# Patient Record
Sex: Female | Born: 1990 | Race: Black or African American | Hispanic: No | Marital: Single | Smoking: Never smoker
Health system: Southern US, Community
[De-identification: ages and names within clinical notes are randomized; demographics above are authoritative.]

## PROBLEM LIST (undated history)

## (undated) DIAGNOSIS — D649 Anemia, unspecified: Secondary | ICD-10-CM

## (undated) DIAGNOSIS — R87629 Unspecified abnormal cytological findings in specimens from vagina: Secondary | ICD-10-CM

## (undated) DIAGNOSIS — Z8619 Personal history of other infectious and parasitic diseases: Secondary | ICD-10-CM

## (undated) DIAGNOSIS — E538 Deficiency of other specified B group vitamins: Secondary | ICD-10-CM

## (undated) HISTORY — DX: Personal history of other infectious and parasitic diseases: Z86.19

## (undated) HISTORY — PX: NO PAST SURGERIES: SHX2092

## (undated) HISTORY — DX: Deficiency of other specified B group vitamins: E53.8

## (undated) HISTORY — DX: Anemia, unspecified: D64.9

---

## 2011-10-17 ENCOUNTER — Encounter: Payer: Self-pay | Admitting: Obstetrics and Gynecology

## 2011-10-25 ENCOUNTER — Encounter: Payer: PRIVATE HEALTH INSURANCE | Admitting: Obstetrics and Gynecology

## 2011-10-28 ENCOUNTER — Encounter: Payer: PRIVATE HEALTH INSURANCE | Admitting: Obstetrics and Gynecology

## 2011-10-28 ENCOUNTER — Encounter (INDEPENDENT_AMBULATORY_CARE_PROVIDER_SITE_OTHER): Payer: PRIVATE HEALTH INSURANCE | Admitting: Registered Nurse

## 2011-10-28 DIAGNOSIS — Z09 Encounter for follow-up examination after completed treatment for conditions other than malignant neoplasm: Secondary | ICD-10-CM

## 2011-12-08 ENCOUNTER — Other Ambulatory Visit: Payer: Self-pay | Admitting: Obstetrics and Gynecology

## 2011-12-08 ENCOUNTER — Ambulatory Visit
Admission: RE | Admit: 2011-12-08 | Discharge: 2011-12-08 | Disposition: A | Discharge status: Home / Self Care | Payer: PRIVATE HEALTH INSURANCE | Source: Ambulatory Visit | Attending: Obstetrics and Gynecology | Admitting: Obstetrics and Gynecology

## 2011-12-08 DIAGNOSIS — N644 Mastodynia: Secondary | ICD-10-CM

## 2012-10-15 ENCOUNTER — Other Ambulatory Visit (HOSPITAL_COMMUNITY)
Admission: RE | Admit: 2012-10-15 | Discharge: 2012-10-15 | Disposition: A | Discharge status: Home / Self Care | Payer: PRIVATE HEALTH INSURANCE | Source: Ambulatory Visit | Attending: Family Medicine | Admitting: Family Medicine

## 2012-10-15 ENCOUNTER — Other Ambulatory Visit: Payer: Self-pay | Admitting: Family Medicine

## 2012-10-15 DIAGNOSIS — Z124 Encounter for screening for malignant neoplasm of cervix: Secondary | ICD-10-CM | POA: Insufficient documentation

## 2013-01-17 ENCOUNTER — Other Ambulatory Visit: Payer: Self-pay

## 2013-01-17 ENCOUNTER — Other Ambulatory Visit: Payer: Self-pay | Admitting: Family Medicine

## 2013-01-17 DIAGNOSIS — Z1231 Encounter for screening mammogram for malignant neoplasm of breast: Secondary | ICD-10-CM

## 2013-01-28 ENCOUNTER — Other Ambulatory Visit: Payer: Self-pay

## 2013-01-28 LAB — OB RESULTS CONSOLE ANTIBODY SCREEN: Antibody Screen: NEGATIVE

## 2013-01-28 LAB — OB RESULTS CONSOLE RUBELLA ANTIBODY, IGM: Rubella: IMMUNE

## 2013-01-28 LAB — OB RESULTS CONSOLE HEPATITIS B SURFACE ANTIGEN: Hepatitis B Surface Ag: NEGATIVE

## 2013-01-28 LAB — OB RESULTS CONSOLE GC/CHLAMYDIA
Chlamydia: NEGATIVE
GC PROBE AMP, GENITAL: NEGATIVE

## 2013-01-28 LAB — OB RESULTS CONSOLE RPR: RPR: NONREACTIVE

## 2013-01-28 LAB — OB RESULTS CONSOLE ABO/RH: RH Type: POSITIVE

## 2013-01-28 LAB — OB RESULTS CONSOLE HIV ANTIBODY (ROUTINE TESTING): HIV: NONREACTIVE

## 2013-07-23 LAB — OB RESULTS CONSOLE GBS: GBS: NEGATIVE

## 2013-08-08 NOTE — L&D Delivery Note (Signed)
Patient was C/C/+2 and pushed for 98 minutes with epidural.   NSVD  female infant, Apgars 8,9, weight P.   The patient had one second degree midline episiotomy repaired with 2-0 Vicryl R. Fundus was firm. EBL was expected. Placenta was delivered intact. Vagina was clear.  Baby was vigorous and doing skin to skin with mother.  Sheneka Schrom A

## 2013-08-21 ENCOUNTER — Telehealth (HOSPITAL_COMMUNITY): Payer: Self-pay | Admitting: *Deleted

## 2013-08-21 ENCOUNTER — Encounter (HOSPITAL_COMMUNITY): Payer: Self-pay | Admitting: *Deleted

## 2013-08-21 NOTE — Telephone Encounter (Signed)
Preadmission screen  

## 2013-08-22 ENCOUNTER — Inpatient Hospital Stay (HOSPITAL_COMMUNITY): Payer: PRIVATE HEALTH INSURANCE

## 2013-08-28 ENCOUNTER — Inpatient Hospital Stay (HOSPITAL_COMMUNITY): Payer: PRIVATE HEALTH INSURANCE | Admitting: Anesthesiology

## 2013-08-28 ENCOUNTER — Encounter (HOSPITAL_COMMUNITY): Payer: Self-pay | Admitting: *Deleted

## 2013-08-28 ENCOUNTER — Inpatient Hospital Stay (HOSPITAL_COMMUNITY): Admission: RE | Admit: 2013-08-28 | Payer: PRIVATE HEALTH INSURANCE | Source: Ambulatory Visit

## 2013-08-28 ENCOUNTER — Inpatient Hospital Stay (HOSPITAL_COMMUNITY)
Admission: AD | Admit: 2013-08-28 | Discharge: 2013-08-30 | DRG: 774 | Disposition: A | Discharge status: Home / Self Care | Payer: PRIVATE HEALTH INSURANCE | Source: Ambulatory Visit | Attending: Obstetrics and Gynecology | Admitting: Obstetrics and Gynecology

## 2013-08-28 ENCOUNTER — Encounter (HOSPITAL_COMMUNITY): Payer: PRIVATE HEALTH INSURANCE | Admitting: Anesthesiology

## 2013-08-28 DIAGNOSIS — O4290 Premature rupture of membranes, unspecified as to length of time between rupture and onset of labor, unspecified weeks of gestation: Principal | ICD-10-CM | POA: Diagnosis present

## 2013-08-28 DIAGNOSIS — O480 Post-term pregnancy: Secondary | ICD-10-CM | POA: Diagnosis present

## 2013-08-28 DIAGNOSIS — O411090 Infection of amniotic sac and membranes, unspecified, unspecified trimester, not applicable or unspecified: Secondary | ICD-10-CM | POA: Diagnosis present

## 2013-08-28 HISTORY — DX: Unspecified abnormal cytological findings in specimens from vagina: R87.629

## 2013-08-28 LAB — CBC
HEMATOCRIT: 35.4 % — AB (ref 36.0–46.0)
Hemoglobin: 11.8 g/dL — ABNORMAL LOW (ref 12.0–15.0)
MCH: 31.1 pg (ref 26.0–34.0)
MCHC: 33.3 g/dL (ref 30.0–36.0)
MCV: 93.4 fL (ref 78.0–100.0)
Platelets: 236 10*3/uL (ref 150–400)
RBC: 3.79 MIL/uL — ABNORMAL LOW (ref 3.87–5.11)
RDW: 13.2 % (ref 11.5–15.5)
WBC: 12.6 10*3/uL — ABNORMAL HIGH (ref 4.0–10.5)

## 2013-08-28 LAB — ABO/RH: ABO/RH(D): A POS

## 2013-08-28 LAB — POCT FERN TEST: POCT Fern Test: POSITIVE

## 2013-08-28 LAB — TYPE AND SCREEN
ABO/RH(D): A POS
Antibody Screen: NEGATIVE

## 2013-08-28 LAB — RPR: RPR: NONREACTIVE

## 2013-08-28 MED ORDER — OXYTOCIN 40 UNITS IN LACTATED RINGERS INFUSION - SIMPLE MED
1.0000 m[IU]/min | INTRAVENOUS | Status: DC
  Administered 2013-08-28: 4 m[IU]/min via INTRAVENOUS
  Administered 2013-08-28: 8 m[IU]/min via INTRAVENOUS
  Administered 2013-08-28: 6 m[IU]/min via INTRAVENOUS
  Administered 2013-08-28: 2 m[IU]/min via INTRAVENOUS
  Filled 2013-08-28: qty 1000

## 2013-08-28 MED ORDER — PHENYLEPHRINE 40 MCG/ML (10ML) SYRINGE FOR IV PUSH (FOR BLOOD PRESSURE SUPPORT)
80.0000 ug | PREFILLED_SYRINGE | INTRAVENOUS | Status: DC | PRN
  Filled 2013-08-28: qty 2

## 2013-08-28 MED ORDER — LIDOCAINE HCL (PF) 1 % IJ SOLN
30.0000 mL | INTRAMUSCULAR | Status: DC | PRN
  Filled 2013-08-28 (×2): qty 30

## 2013-08-28 MED ORDER — FENTANYL 2.5 MCG/ML BUPIVACAINE 1/10 % EPIDURAL INFUSION (WH - ANES)
INTRAMUSCULAR | Status: DC | PRN
  Administered 2013-08-28: 15 mL/h via EPIDURAL

## 2013-08-28 MED ORDER — OXYCODONE-ACETAMINOPHEN 5-325 MG PO TABS: 1.0000 | ORAL_TABLET | ORAL | Status: DC | PRN

## 2013-08-28 MED ORDER — TERBUTALINE SULFATE 1 MG/ML IJ SOLN: 0.2500 mg | Freq: Once | INTRAMUSCULAR | Status: AC | PRN

## 2013-08-28 MED ORDER — LACTATED RINGERS IV SOLN
500.0000 mL | Freq: Once | INTRAVENOUS | Status: AC
  Administered 2013-08-28: 500 mL via INTRAVENOUS

## 2013-08-28 MED ORDER — ACETAMINOPHEN 500 MG PO TABS
1000.0000 mg | ORAL_TABLET | Freq: Four times a day (QID) | ORAL | Status: DC | PRN
  Administered 2013-08-28 – 2013-08-29 (×2): 1000 mg via ORAL
  Filled 2013-08-28 (×3): qty 2

## 2013-08-28 MED ORDER — FENTANYL 2.5 MCG/ML BUPIVACAINE 1/10 % EPIDURAL INFUSION (WH - ANES)
INTRAMUSCULAR | Status: AC
Start: 2013-08-28 — End: 2013-08-29
  Filled 2013-08-28: qty 125

## 2013-08-28 MED ORDER — DEXTROSE 5 % IV SOLN
220.0000 mg | Freq: Three times a day (TID) | INTRAVENOUS | Status: DC
  Administered 2013-08-28 – 2013-08-29 (×2): 220 mg via INTRAVENOUS
  Filled 2013-08-28 (×3): qty 5.5

## 2013-08-28 MED ORDER — EPHEDRINE 5 MG/ML INJ
INTRAVENOUS | Status: AC
  Filled 2013-08-28: qty 4

## 2013-08-28 MED ORDER — OXYTOCIN BOLUS FROM INFUSION: 500.0000 mL | INTRAVENOUS | Status: DC

## 2013-08-28 MED ORDER — ACETAMINOPHEN 325 MG PO TABS: 650.0000 mg | ORAL_TABLET | ORAL | Status: DC | PRN

## 2013-08-28 MED ORDER — DIPHENHYDRAMINE HCL 50 MG/ML IJ SOLN: 12.5000 mg | INTRAMUSCULAR | Status: DC | PRN

## 2013-08-28 MED ORDER — SODIUM CHLORIDE 0.9 % IV SOLN
2.0000 g | Freq: Four times a day (QID) | INTRAVENOUS | Status: DC
  Administered 2013-08-28 – 2013-08-29 (×2): 2 g via INTRAVENOUS
  Filled 2013-08-28 (×4): qty 2000

## 2013-08-28 MED ORDER — OXYTOCIN 40 UNITS IN LACTATED RINGERS INFUSION - SIMPLE MED
62.5000 mL/h | INTRAVENOUS | Status: DC
  Administered 2013-08-29: 62.5 mL/h via INTRAVENOUS

## 2013-08-28 MED ORDER — LACTATED RINGERS IV SOLN
INTRAVENOUS | Status: DC
  Administered 2013-08-28 – 2013-08-29 (×4): via INTRAVENOUS

## 2013-08-28 MED ORDER — LACTATED RINGERS IV SOLN
500.0000 mL | INTRAVENOUS | Status: DC | PRN
  Administered 2013-08-28: 200 mL via INTRAVENOUS

## 2013-08-28 MED ORDER — IBUPROFEN 600 MG PO TABS: 600.0000 mg | ORAL_TABLET | Freq: Four times a day (QID) | ORAL | Status: DC | PRN

## 2013-08-28 MED ORDER — ONDANSETRON HCL 4 MG/2ML IJ SOLN: 4.0000 mg | Freq: Four times a day (QID) | INTRAMUSCULAR | Status: DC | PRN

## 2013-08-28 MED ORDER — EPHEDRINE 5 MG/ML INJ
10.0000 mg | INTRAVENOUS | Status: DC | PRN
  Filled 2013-08-28: qty 2

## 2013-08-28 MED ORDER — PHENYLEPHRINE 40 MCG/ML (10ML) SYRINGE FOR IV PUSH (FOR BLOOD PRESSURE SUPPORT)
PREFILLED_SYRINGE | INTRAVENOUS | Status: AC
  Filled 2013-08-28: qty 10

## 2013-08-28 MED ORDER — CITRIC ACID-SODIUM CITRATE 334-500 MG/5ML PO SOLN: 30.0000 mL | ORAL | Status: DC | PRN

## 2013-08-28 MED ORDER — LIDOCAINE HCL (PF) 1 % IJ SOLN
INTRAMUSCULAR | Status: DC | PRN
  Administered 2013-08-28: 5 mL
  Administered 2013-08-28: 4 mL

## 2013-08-28 MED ORDER — FENTANYL 2.5 MCG/ML BUPIVACAINE 1/10 % EPIDURAL INFUSION (WH - ANES)
14.0000 mL/h | INTRAMUSCULAR | Status: DC | PRN
  Administered 2013-08-29: 14 mL/h via EPIDURAL
  Filled 2013-08-28: qty 125

## 2013-08-28 NOTE — MAU Note (Signed)
UC's since 0530. States felt a gush of fluid after arrival to MAU. Fern slide pending.

## 2013-08-28 NOTE — Anesthesia Procedure Notes (Signed)
Epidural Patient location during procedure: OB Start time: 08/28/2013 2:23 PM  Staffing Anesthesiologist: Michaeljohn Biss A. Performed by: anesthesiologist   Preanesthetic Checklist Completed: patient identified, site marked, surgical consent, pre-op evaluation, timeout performed, IV checked, risks and benefits discussed and monitors and equipment checked  Epidural Patient position: sitting Prep: site prepped and draped and DuraPrep Patient monitoring: continuous pulse ox and blood pressure Approach: midline Injection technique: LOR air  Needle:  Needle type: Tuohy  Needle gauge: 17 G Needle length: 9 cm and 9 Needle insertion depth: 6 cm Catheter type: closed end flexible Catheter size: 19 Gauge Catheter at skin depth: 11 cm Test dose: negative and Other  Assessment Events: blood not aspirated, injection not painful, no injection resistance, negative IV test and no paresthesia  Additional Notes Patient identified. Risks and benefits discussed including failed block, incomplete  Pain control, post dural puncture headache, nerve damage, paralysis, blood pressure Changes, nausea, vomiting, reactions to medications-both toxic and allergic and post Partum back pain. All questions were answered. Patient expressed understanding and wished to proceed. Sterile technique was used throughout procedure. Epidural site was Dressed with sterile barrier dressing. No paresthesias, signs of intravascular injection Or signs of intrathecal spread were encountered.  Patient was more comfortable after the epidural was dosed. Please see RN's note for documentation of vital signs and FHR which are stable.

## 2013-08-28 NOTE — H&P (Signed)
23 y.o. 3990w3d  G2P0010 comes in c/o ctx, did not change cervix however started LOF in MAU, found to be ruptured.  Otherwise has good fetal movement and no bleeding.  Past Medical History  Diagnosis Date  . Hx of varicella   . Vaginal Pap smear, abnormal     Past Surgical History  Procedure Laterality Date  . No past surgeries      OB History  Gravida Para Term Preterm AB SAB TAB Ectopic Multiple Living  2    1  1    0    # Outcome Date GA Lbr Len/2nd Weight Sex Delivery Anes PTL Lv  2 CUR           1 TAB 2011              History   Social History  . Marital Status: Single    Spouse Name: N/A    Number of Children: N/A  . Years of Education: N/A   Occupational History  . Not on file.   Social History Main Topics  . Smoking status: Never Smoker   . Smokeless tobacco: Never Used  . Alcohol Use: No  . Drug Use: No  . Sexual Activity: Yes   Other Topics Concern  . Not on file   Social History Narrative  . No narrative on file   Review of patient's allergies indicates no known allergies.    Prenatal Transfer Tool  Maternal Diabetes: No Genetic Screening: Declined Maternal Ultrasounds/Referrals: Normal Fetal Ultrasounds or other Referrals:  None Maternal Substance Abuse:  No Significant Maternal Medications:  None Significant Maternal Lab Results: Lab values include: Group B Strep negative  Other PNC: uncomplicated.    Filed Vitals:   08/28/13 1701  BP: 111/62  Pulse: 105  Temp:   Resp: 18     Lungs/Cor:  NAD Abdomen:  soft, gravid Ex:  no cords, erythema SVE:  1/7-/-3, now 2/80 FHTs:  140 good STV, NST R Toco:  q3  A/P   Admit with PROM  GBS neg  Pit 2x2  Comfortable with epidural  Desires circumcision.  Philip AspenALLAHAN, Sylvia Helms

## 2013-08-28 NOTE — Anesthesia Preprocedure Evaluation (Signed)

## 2013-08-28 NOTE — Progress Notes (Signed)
ANTIBIOTIC CONSULT NOTE - INITIAL  Pharmacy Consult for Gentamicin Indication: Maternal temp/ r/o chorioamnionitis  No Known Allergies  Patient Measurements: Height: 5' 9.5" (176.5 cm) Weight: 253 lb (114.76 kg) IBW/kg (Calculated) : 67.35 Adjusted Body Weight: 81.6kg  Vital Signs: Temp: 100.6 F (38.1 C) (01/21 1932) Temp src: Axillary (01/21 1932) BP: 136/70 mmHg (01/21 1932) Pulse Rate: 99 (01/21 1932)   Labs:  Recent Labs  08/28/13 1020  WBC 12.6*  HGB 11.8*  PLT 236   Estimated SCr=0.7 with calculated CrCl > 18020ml/min.  Microbiology: No results found for this or any previous visit (from the past 720 hour(s)).  Medical History: Past Medical History  Diagnosis Date  . Hx of varicella   . Vaginal Pap smear, abnormal     Medications:  Ampicillin 2 gram IV q6h  Assessment: 23yo F 41+ weeks admitted with contractions and PROM. Pt has now temp during labor. Ampicillin and Gentamicin initiated to r/o chorioamnionitis.  Goal of Therapy:  Gentamicin peak 6-298mcg/ml  And trough < 331mcg/ml  Plan:  1. Gentamicin 220mg  IV q8h. 2. Will draw SCr if Gentamicin continued postpartum. 3. Will continue to follow and assess need for further kinetic workup. Thanks!  Claybon Jabsngel, Yuleidy Rappleye G 08/28/2013,8:07 PM

## 2013-08-29 ENCOUNTER — Encounter (HOSPITAL_COMMUNITY): Payer: Self-pay | Admitting: *Deleted

## 2013-08-29 MED ORDER — PRENATAL MULTIVITAMIN CH
1.0000 | ORAL_TABLET | Freq: Every day | ORAL | Status: DC
  Administered 2013-08-29 – 2013-08-30 (×2): 1 via ORAL
  Filled 2013-08-29 (×2): qty 1

## 2013-08-29 MED ORDER — MEASLES, MUMPS & RUBELLA VAC ~~LOC~~ INJ
0.5000 mL | INJECTION | Freq: Once | SUBCUTANEOUS | Status: DC
  Filled 2013-08-29: qty 0.5

## 2013-08-29 MED ORDER — FERROUS SULFATE 325 (65 FE) MG PO TABS
325.0000 mg | ORAL_TABLET | Freq: Two times a day (BID) | ORAL | Status: DC
  Administered 2013-08-29 – 2013-08-30 (×2): 325 mg via ORAL
  Filled 2013-08-29: qty 1

## 2013-08-29 MED ORDER — SODIUM CHLORIDE 0.9 % IJ SOLN
3.0000 mL | INTRAMUSCULAR | Status: DC | PRN
Start: 2013-08-29 — End: 2013-08-30

## 2013-08-29 MED ORDER — DIPHENHYDRAMINE HCL 25 MG PO CAPS
25.0000 mg | ORAL_CAPSULE | Freq: Four times a day (QID) | ORAL | Status: DC | PRN
Start: 2013-08-29 — End: 2013-08-30

## 2013-08-29 MED ORDER — SODIUM CHLORIDE 0.9 % IV SOLN
250.0000 mL | INTRAVENOUS | Status: DC | PRN
Start: 2013-08-29 — End: 2013-08-30

## 2013-08-29 MED ORDER — TETANUS-DIPHTH-ACELL PERTUSSIS 5-2.5-18.5 LF-MCG/0.5 IM SUSP
0.5000 mL | Freq: Once | INTRAMUSCULAR | Status: AC
  Administered 2013-08-30: 0.5 mL via INTRAMUSCULAR
  Filled 2013-08-29: qty 0.5

## 2013-08-29 MED ORDER — IBUPROFEN 800 MG PO TABS
800.0000 mg | ORAL_TABLET | Freq: Three times a day (TID) | ORAL | Status: DC
  Administered 2013-08-29 – 2013-08-30 (×4): 800 mg via ORAL
  Filled 2013-08-29 (×4): qty 1

## 2013-08-29 MED ORDER — SENNOSIDES-DOCUSATE SODIUM 8.6-50 MG PO TABS
2.0000 | ORAL_TABLET | ORAL | Status: DC
  Administered 2013-08-29: 2 via ORAL
  Filled 2013-08-29: qty 2

## 2013-08-29 MED ORDER — SIMETHICONE 80 MG PO CHEW: 80.0000 mg | CHEWABLE_TABLET | ORAL | Status: DC | PRN

## 2013-08-29 MED ORDER — BENZOCAINE-MENTHOL 20-0.5 % EX AERO
1.0000 | INHALATION_SPRAY | CUTANEOUS | Status: DC | PRN
Start: 2013-08-29 — End: 2013-08-30
  Filled 2013-08-29: qty 56

## 2013-08-29 MED ORDER — ONDANSETRON HCL 4 MG PO TABS: 4.0000 mg | ORAL_TABLET | ORAL | Status: DC | PRN

## 2013-08-29 MED ORDER — ZOLPIDEM TARTRATE 5 MG PO TABS: 5.0000 mg | ORAL_TABLET | Freq: Every evening | ORAL | Status: DC | PRN

## 2013-08-29 MED ORDER — ONDANSETRON HCL 4 MG/2ML IJ SOLN: 4.0000 mg | INTRAMUSCULAR | Status: DC | PRN

## 2013-08-29 MED ORDER — OXYCODONE-ACETAMINOPHEN 5-325 MG PO TABS: 1.0000 | ORAL_TABLET | ORAL | Status: DC | PRN

## 2013-08-29 MED ORDER — WITCH HAZEL-GLYCERIN EX PADS: 1.0000 "application " | MEDICATED_PAD | CUTANEOUS | Status: DC | PRN

## 2013-08-29 MED ORDER — MAGNESIUM HYDROXIDE 400 MG/5ML PO SUSP
30.0000 mL | ORAL | Status: DC | PRN
Start: 2013-08-29 — End: 2013-08-30

## 2013-08-29 MED ORDER — METHYLERGONOVINE MALEATE 0.2 MG/ML IJ SOLN: 0.2000 mg | INTRAMUSCULAR | Status: DC | PRN

## 2013-08-29 MED ORDER — LANOLIN HYDROUS EX OINT
TOPICAL_OINTMENT | CUTANEOUS | Status: DC | PRN
Start: 2013-08-29 — End: 2013-08-30

## 2013-08-29 MED ORDER — SODIUM CHLORIDE 0.9 % IJ SOLN
3.0000 mL | Freq: Two times a day (BID) | INTRAMUSCULAR | Status: DC
  Administered 2013-08-29: 3 mL via INTRAVENOUS

## 2013-08-29 MED ORDER — DIBUCAINE 1 % RE OINT
1.0000 | TOPICAL_OINTMENT | RECTAL | Status: DC | PRN
Start: 2013-08-29 — End: 2013-08-30

## 2013-08-29 MED ORDER — METHYLERGONOVINE MALEATE 0.2 MG PO TABS: 0.2000 mg | ORAL_TABLET | ORAL | Status: DC | PRN

## 2013-08-29 NOTE — Lactation Note (Signed)
This note was copied from the chart of Boy Devona KonigFelia Werden. Lactation Consultation Note  Patient Name: Boy Devona KonigFelia Hurston HYQMV'HToday's Date: 08/29/2013 Reason for consult: Initial assessment Per mom recently had a bath and is skin to skin  At present.  Baby noted to be rooting, LC offered to assist with latch due to feeding  Cues. Mom declined at present time and plans on  paging because he is skin to skin. LC explained LATCH scoring and the importance of feeding assessments.  Mom aware of the [facility] and the LC O/Pservices.     Maternal Data Formula Feeding for Exclusion: No Infant to breast within first hour of birth: Yes Does the patient have breastfeeding experience prior to this delivery?: No  Feeding Feeding Type:  (LC offered to assist with latch, mom declined at this time , will page , baby skin to skin )  LATCH Score/Interventions Latch:  (LC explained the latch scoring , enc to page for feeding assess)  Audible Swallowing: None  Type of Nipple: Everted at rest and after stimulation  Comfort (Breast/Nipple): Soft / non-tender     Hold (Positioning): Assistance needed to correctly position infant at breast and maintain latch.  LATCH Score: 5  Lactation Tools Discussed/Used WIC Program: Yes   Consult Status Consult Status: Follow-up (To page ) Date: 08/29/13 Follow-up type: In-patient    Kathrin Greathouseorio, Terrianna Holsclaw Ann 08/29/2013, 3:04 PM

## 2013-08-29 NOTE — Lactation Note (Signed)
This note was copied from the chart of Andrea Rojas. Lactation Consultation Note Follow up visit at 13 hours of age.  Family reports baby just got a bottle 10 minutes ago.  Maybe 1 ml out of bottle.  Mom reports she wants to breast and formula feed.  Discussed the importance of exclusively with breast feeding to establish milk supply. Discussed frequency, out put, cue feedings, cluster feedings and hand expression. Mom reports she can hand express and sees colostrum.  Visitor holding baby, baby awake and cuing.  Encouraged mom to breast feed and offered to assist, mom declines.  Encouraged mom to think about how she wants to feed baby and discuss with nurse to make sure baby eats something.  Unsure if baby has good suck and swallow with bottle. Mom appears to have flat affect. Offered support to mom and encouraged her to call for assist as needed. [facility].    Patient Name: Andrea Rojas [id number]'XToday's Date: 08/29/2013 Reason for consult: Follow-up assessment   Maternal Data    Feeding    LATCH Score/Interventions                      Lactation Tools Discussed/Used     Consult Status Consult Status: PRN    Jannifer RodneyShoptaw, Drena Ham Lynn 08/29/2013, 9:21 PM

## 2013-08-29 NOTE — Anesthesia Postprocedure Evaluation (Signed)
  Anesthesia Post-op Note  Patient: Andrea Rojas  Procedure(s) Performed: * No procedures listed *  Patient Location: Mother/Baby  Anesthesia Type:Epidural  Level of Consciousness: awake  Airway and Oxygen Therapy: Patient Spontanous Breathing  Post-op Pain: mild  Post-op Assessment: Patient's Cardiovascular Status Stable and Respiratory Function Stable  Post-op Vital Signs: stable  Complications: No apparent anesthesia complications

## 2013-08-30 LAB — CBC
HCT: 28.9 % — ABNORMAL LOW (ref 36.0–46.0)
Hemoglobin: 9.7 g/dL — ABNORMAL LOW (ref 12.0–15.0)
MCH: 31.3 pg (ref 26.0–34.0)
MCHC: 33.6 g/dL (ref 30.0–36.0)
MCV: 93.2 fL (ref 78.0–100.0)
PLATELETS: 218 10*3/uL (ref 150–400)
RBC: 3.1 MIL/uL — AB (ref 3.87–5.11)
RDW: 13.3 % (ref 11.5–15.5)
WBC: 16.4 10*3/uL — AB (ref 4.0–10.5)

## 2013-08-30 MED ORDER — DOCUSATE SODIUM 100 MG PO CAPS: 100.0000 mg | ORAL_CAPSULE | Freq: Two times a day (BID) | ORAL | Status: DC

## 2013-08-30 MED ORDER — OXYCODONE-ACETAMINOPHEN 5-325 MG PO TABS: 2.0000 | ORAL_TABLET | ORAL | Status: DC | PRN

## 2013-08-30 MED ORDER — IBUPROFEN 600 MG PO TABS: 600.0000 mg | ORAL_TABLET | Freq: Four times a day (QID) | ORAL | Status: DC | PRN

## 2013-08-30 NOTE — Discharge Summary (Signed)
Obstetric Discharge Summary Reason for Admission: onset of labor Prenatal Procedures: none Intrapartum Procedures: spontaneous vaginal delivery Postpartum Procedures: none Complications-Operative and Postpartum: 2nd degree perineal laceration Hemoglobin  Date Value Range Status  08/30/2013 9.7* 12.0 - 15.0 g/dL Final     DELTA CHECK NOTED     REPEATED TO VERIFY     HCT  Date Value Range Status  08/30/2013 28.9* 36.0 - 46.0 % Final    Physical Exam:  General: alert, cooperative and appears stated age 65Lochia: appropriate Uterine Fundus: firm  Discharge Diagnoses: Term Pregnancy-delivered and Post-date pregnancy  Discharge Information: Date: 08/30/2013 Activity: pelvic rest Diet: routine Medications: Ibuprofen, Colace and Percocet Condition: improved Instructions: refer to practice specific booklet Discharge to: home Follow-up Information   Follow up with HORVATH,MICHELLE A, MD In 4 weeks. (For a PP evaluation)    Specialty:  Obstetrics and Gynecology   Contact information:   [address] [place] [address] [phone]       Newborn Data: Live born female  Birth Weight: 8 lb 6.2 oz (3805 g) APGAR: 8, 9  Home with mother.  Lugene Beougher H. 08/30/2013, 9:55 AM

## 2013-08-30 NOTE — Discharge Instructions (Signed)
Iron-Rich Diet  An iron-rich diet contains foods that are good sources of iron. Iron is an important mineral that helps your body produce hemoglobin. Hemoglobin is a protein in red blood cells that carries oxygen to the body's tissues. Sometimes, the iron level in your blood can be low. This may be caused by:  A lack of iron in your diet.  Blood loss.  Times of growth, such as during pregnancy or during a child's growth and development. Low levels of iron can cause a decrease in the number of red blood cells. This can result in iron deficiency anemia. Iron deficiency anemia symptoms include:  Tiredness.  Weakness.  Irritability.  Increased chance of infection. Here are some recommendations for daily iron intake:  Males older than 25 years of age need 8 mg of iron per day.  Women ages 1019 to 3750 need 18 mg of iron per day.  Pregnant women need 27 mg of iron per day, and women who are over 25 years of age and breastfeeding need 9 mg of iron per day.  Women over the age of 25 need 8 mg of iron per day. SOURCES OF IRON There are 2 types of iron that are found in food: heme iron and nonheme iron. Heme iron is absorbed by the body better than nonheme iron. Heme iron is found in meat, poultry, and fish. Nonheme iron is found in grains, beans, and vegetables. Heme Iron Sources Food / Iron (mg)  Chicken liver, 3 oz (85 g)/ 10 mg  Beef liver, 3 oz (85 g)/ 5.5 mg  Oysters, 3 oz (85 g)/ 8 mg  Beef, 3 oz (85 g)/ 2 to 3 mg  Shrimp, 3 oz (85 g)/ 2.8 mg  [place], 3 oz (85 g)/ 2 mg  Chicken, 3 oz (85 g) / 1 mg  Fish (tuna, halibut), 3 oz (85 g)/ 1 mg  Pork, 3 oz (85 g)/ 0.9 mg Nonheme Iron Sources Food / Iron (mg)  Ready-to-eat breakfast cereal, iron-fortified / 3.9 to 7 mg  Tofu,  cup / 3.4 mg  Kidney beans,  cup / 2.6 mg  Baked potato with skin / 2.7 mg  Asparagus,  cup / 2.2 mg  Avocado / 2 mg  Dried peaches,  cup / 1.6 mg  Raisins,  cup / 1.5 mg  Soy milk,  1 cup / 1.5 mg  Whole-wheat bread, 1 slice / 1.2 mg  Spinach, 1 cup / 0.8 mg  Broccoli,  cup / 0.6 mg IRON ABSORPTION Certain foods can decrease the body's absorption of iron. Try to avoid these foods and beverages while eating meals with iron-containing foods:  Coffee.  Tea.  Fiber.  Soy. Foods containing vitamin C can help increase the amount of iron your body absorbs from iron sources, especially from nonheme sources. Eat foods with vitamin C along with iron-containing foods to increase your iron absorption. Foods that are high in vitamin C include many fruits and vegetables. Some good sources are:  Fresh orange juice.  Oranges.  Strawberries.  Mangoes.  Grapefruit.  Red bell peppers.  Green bell peppers.  Broccoli.  Potatoes with skin.  Tomato juice. Document Released: 03/08/2005 Document Revised: 10/17/2011 Document Reviewed: 01/13/2011 [facility] [employer] Patient Information 2014 [place], [place].  Breastfeeding Deciding to breastfeed is one of the best choices you can make for you and your baby. A change in hormones during pregnancy causes your breast tissue to grow and increases the number and size of your milk ducts. These hormones  also allow proteins, sugars, and fats from your blood supply to make breast milk in your milk-producing glands. Hormones prevent breast milk from being released before your baby is born as well as prompt milk flow after birth. Once breastfeeding has begun, thoughts of your baby, as well as his or her sucking or crying, can stimulate the release of milk from your milk-producing glands.  BENEFITS OF BREASTFEEDING For Your Baby  Your first milk (colostrum) helps your baby's digestive system function better.   There are antibodies in your milk that help your baby fight off infections.   Your baby has a lower incidence of asthma, allergies, and sudden infant death syndrome.   The nutrients in breast milk are better for your baby than infant  formulas and are designed uniquely for your baby's needs.   Breast milk improves your baby's brain development.   Your baby is less likely to develop other conditions, such as childhood obesity, asthma, or type 2 diabetes mellitus.  For You   Breastfeeding helps to create a very special bond between you and your baby.   Breastfeeding is convenient. Breast milk is always available at the correct temperature and costs nothing.   Breastfeeding helps to burn calories and helps you lose the weight gained during pregnancy.   Breastfeeding makes your uterus contract to its prepregnancy size faster and slows bleeding (lochia) after you give birth.   Breastfeeding helps to lower your risk of developing type 2 diabetes mellitus, osteoporosis, and breast or ovarian cancer later in life. SIGNS THAT YOUR BABY IS HUNGRY Early Signs of Hunger  Increased alertness or activity.  Stretching.  Movement of the head from side to side.  Movement of the head and opening of the mouth when the corner of the mouth or cheek is stroked (rooting).  Increased sucking sounds, smacking lips, cooing, sighing, or squeaking.  Hand-to-mouth movements.  Increased sucking of fingers or hands. Late Signs of Hunger  Fussing.  Intermittent crying. Extreme Signs of Hunger Signs of extreme hunger will require calming and consoling before your baby will be able to breastfeed successfully. Do not wait for the following signs of extreme hunger to occur before you initiate breastfeeding:   Restlessness.  A loud, strong cry.   Screaming. BREASTFEEDING BASICS Breastfeeding Initiation  Find a comfortable place to sit or lie down, with your neck and back well supported.  Place a pillow or rolled up blanket under your baby to bring him or her to the level of your breast (if you are seated). Nursing pillows are specially designed to help support your arms and your baby while you breastfeed.  Make sure that  your baby's abdomen is facing your abdomen.   Gently massage your breast. With your fingertips, massage from your chest wall toward your nipple in a circular motion. This encourages milk flow. You may need to continue this action during the feeding if your milk flows slowly.  Support your breast with 4 fingers underneath and your thumb above your nipple. Make sure your fingers are well away from your nipple and your baby's mouth.   Stroke your baby's lips gently with your finger or nipple.   When your baby's mouth is open wide enough, quickly bring your baby to your breast, placing your entire nipple and as much of the colored area around your nipple (areola) as possible into your baby's mouth.   More areola should be visible above your baby's upper lip than below the lower lip.  Your baby's tongue should be between his or her lower gum and your breast.   Ensure that your baby's mouth is correctly positioned around your nipple (latched). Your baby's lips should create a seal on your breast and be turned out (everted).  It is common for your baby to suck about 2 3 minutes in order to start the flow of breast milk. Latching Teaching your baby how to latch on to your breast properly is very important. An improper latch can cause nipple pain and decreased milk supply for you and poor weight gain in your baby. Also, if your baby is not latched onto your nipple properly, he or she may swallow some air during feeding. This can make your baby fussy. Burping your baby when you switch breasts during the feeding can help to get rid of the air. However, teaching your baby to latch on properly is still the best way to prevent fussiness from swallowing air while breastfeeding. Signs that your baby has successfully latched on to your nipple:    Silent tugging or silent sucking, without causing you pain.   Swallowing heard between every 3 4 sucks.    Muscle movement above and in front of his or  her ears while sucking.  Signs that your baby has not successfully latched on to nipple:   Sucking sounds or smacking sounds from your baby while breastfeeding.  Nipple pain. If you think your baby has not latched on correctly, slip your finger into the corner of your baby's mouth to break the suction and place it between your baby's gums. Attempt breastfeeding initiation again. Signs of Successful Breastfeeding Signs from your baby:   A gradual decrease in the number of sucks or complete cessation of sucking.   Falling asleep.   Relaxation of his or her body.   Retention of a small amount of milk in his or her mouth.   Letting go of your breast by himself or herself. Signs from you:  Breasts that have increased in firmness, weight, and size 1 3 hours after feeding.   Breasts that are softer immediately after breastfeeding.  Increased milk volume, as well as a change in milk consistency and color by the 5th day of breastfeeding.   Nipples that are not sore, cracked, or bleeding. Signs That Your Pecola Leisure is Getting Enough Milk  Wetting at least 3 diapers in a 24-hour period. The urine should be clear and pale yellow by age 370 days.  At least 3 stools in a 24-hour period by age 370 days. The stool should be soft and yellow.  At least 3 stools in a 24-hour period by age 35 days. The stool should be seedy and yellow.  No loss of weight greater than 10% of birth weight during the first 2 days of age.  Average weight gain of 4 7 ounces (120 210 mL) per week after age 61 days.  Consistent daily weight gain by age 370 days, without weight loss after the age of 2 weeks. After a feeding, your baby may spit up a small amount. This is common. BREASTFEEDING FREQUENCY AND DURATION Frequent feeding will help you make more milk and can prevent sore nipples and breast engorgement. Breastfeed when you feel the need to reduce the fullness of your breasts or when your baby shows signs of hunger.  This is called "breastfeeding on demand." Avoid introducing a pacifier to your baby while you are working to establish breastfeeding (the first 4 6 weeks after your baby  is born). After this time you may choose to use a pacifier. Research has shown that pacifier use during the first year of a baby's life decreases the risk of sudden infant death syndrome (SIDS). Allow your baby to feed on each breast as long as he or she wants. Breastfeed until your baby is finished feeding. When your baby unlatches or falls asleep while feeding from the first breast, offer the second breast. Because newborns are often sleepy in the first few weeks of life, you may need to awaken your baby to get him or her to feed. Breastfeeding times will vary from baby to baby. However, the following rules can serve as a guide to help you ensure that your baby is properly fed:  Newborns (babies 63 weeks of age or younger) may breastfeed every 1 3 hours.  Newborns should not go longer than 3 hours during the day or 5 hours during the night without breastfeeding.  You should breastfeed your baby a minimum of 8 times in a 24-hour period until you begin to introduce solid foods to your baby at around 50 months of age. BREAST MILK PUMPING Pumping and storing breast milk allows you to ensure that your baby is exclusively fed your breast milk, even at times when you are unable to breastfeed. This is especially important if you are going back to work while you are still breastfeeding or when you are not able to be present during feedings. Your lactation consultant can give you guidelines on how long it is safe to store breast milk.  A breast pump is a machine that allows you to pump milk from your breast into a sterile bottle. The pumped breast milk can then be stored in a refrigerator or freezer. Some breast pumps are operated by hand, while others use electricity. Ask your lactation consultant which type will work best for you. Breast pumps  can be purchased, but some hospitals and breastfeeding support groups lease breast pumps on a monthly basis. A lactation consultant can teach you how to hand express breast milk, if you prefer not to use a pump.  CARING FOR YOUR BREASTS WHILE YOU BREASTFEED Nipples can become dry, cracked, and sore while breastfeeding. The following recommendations can help keep your breasts moisturized and healthy:  Avoid using soap on your nipples.   Wear a supportive bra. Although not required, special nursing bras and tank tops are designed to allow access to your breasts for breastfeeding without taking off your entire bra or top. Avoid wearing underwire style bras or extremely tight bras.  Air dry your nipples for 3 after each feeding.   Use only cotton bra pads to absorb leaked breast milk. Leaking of breast milk between feedings is normal.   Use lanolin on your nipples after breastfeeding. Lanolin helps to maintain your skin's normal moisture barrier. If you use pure lanolin you do not need to wash it off before feeding your baby again. Pure lanolin is not toxic to your baby. You may also hand express a few drops of breast milk and gently massage that milk into your nipples and allow the milk to air dry. In the first few weeks after giving birth, some women experience extremely full breasts (engorgement). Engorgement can make your breasts feel heavy, warm, and tender to the touch. Engorgement peaks within 3 5 days after you give birth. The following recommendations can help ease engorgement:  Completely empty your breasts while breastfeeding or pumping. You may want to start by  applying warm, moist heat (in the shower or with warm water-soaked hand towels) just before feeding or pumping. This increases circulation and helps the milk flow. If your baby does not completely empty your breasts while breastfeeding, pump any extra milk after he or she is finished.  Wear a snug bra (nursing or regular)  or tank top for 1 2 days to signal your body to slightly decrease milk production.  Apply ice packs to your breasts, unless this is too uncomfortable for you.  Make sure that your baby is latched on and positioned properly while breastfeeding. If engorgement persists after 48 hours of following these recommendations, contact your health care provider or a Advertising copywriter. OVERALL HEALTH CARE RECOMMENDATIONS WHILE BREASTFEEDING  Eat healthy foods. Alternate between meals and snacks, eating 3 of each per day. Because what you eat affects your breast milk, some of the foods may make your baby more irritable than usual. Avoid eating these foods if you are sure that they are negatively affecting your baby.  Drink milk, fruit juice, and water to satisfy your thirst (about 10 glasses a day).   Rest often, relax, and continue to take your prenatal vitamins to prevent fatigue, stress, and anemia.  Continue breast self-awareness checks.  Avoid chewing and smoking tobacco.  Avoid alcohol and drug use. Some medicines that may be harmful to your baby can pass through breast milk. It is important to ask your health care provider before taking any medicine, including all over-the-counter and prescription medicine as well as vitamin and herbal supplements. It is possible to become pregnant while breastfeeding. If birth control is desired, ask your health care provider about options that will be safe for your baby. SEEK MEDICAL CARE IF:   You feel like you want to stop breastfeeding or have become frustrated with breastfeeding.  You have painful breasts or nipples.  Your nipples are cracked or bleeding.  Your breasts are red, tender, or warm.  You have a swollen area on either breast.  You have a fever or chills.  You have nausea or vomiting.  You have drainage other than breast milk from your nipples.  Your breasts do not become full before feedings by the 5th day after you give  birth.  You feel sad and depressed.  Your baby is too sleepy to eat well.  Your baby is having trouble sleeping.   Your baby is wetting less than 3 diapers in a 24-hour period.  Your baby has less than 3 stools in a 24-hour period.  Your baby's skin or the white part of his or her eyes becomes yellow.   Your baby is not gaining weight by 75 days of age. SEEK IMMEDIATE MEDICAL CARE IF:   Your baby is overly tired (lethargic) and does not want to wake up and feed.  Your baby develops an unexplained fever. Document Released: 07/25/2005 Document Revised: 03/27/2013 Document Reviewed: 01/16/2013 [facility] Patient Information 2014 [place], [place].

## 2013-08-30 NOTE — Procedures (Signed)
Pre-Procedure Diagnosis: Elective Circumcision of female infant per parent request Post-Procedure Diagnosis: Same Procedure: Circumsion of female infant Surgeon: Laveah Gloster, MD Anesthesia: Dorsal penile block with 1cc of 1% lidocaine/Na Bicarb 0.1 mEq EBL: min Complications: none  Neonatal circumcision completed with 1.1 cm gomco clamp after dorsal penile block administered. The infant tolerated the procedure well. Gelfoam was applied after the procedure. EBL minimal.  

## 2014-06-09 ENCOUNTER — Encounter (HOSPITAL_COMMUNITY): Payer: Self-pay | Admitting: *Deleted

## 2014-07-24 ENCOUNTER — Encounter (HOSPITAL_COMMUNITY): Payer: Self-pay | Admitting: Emergency Medicine

## 2014-07-24 ENCOUNTER — Emergency Department (HOSPITAL_COMMUNITY)
Admission: EM | Admit: 2014-07-24 | Discharge: 2014-07-24 | Disposition: A | Discharge status: Home / Self Care | Payer: PRIVATE HEALTH INSURANCE | Attending: Emergency Medicine | Admitting: Emergency Medicine

## 2014-07-24 DIAGNOSIS — Z8619 Personal history of other infectious and parasitic diseases: Secondary | ICD-10-CM | POA: Diagnosis not present

## 2014-07-24 DIAGNOSIS — Z79899 Other long term (current) drug therapy: Secondary | ICD-10-CM | POA: Insufficient documentation

## 2014-07-24 DIAGNOSIS — R945 Abnormal results of liver function studies: Secondary | ICD-10-CM | POA: Insufficient documentation

## 2014-07-24 DIAGNOSIS — M5489 Other dorsalgia: Secondary | ICD-10-CM | POA: Insufficient documentation

## 2014-07-24 DIAGNOSIS — R1011 Right upper quadrant pain: Secondary | ICD-10-CM | POA: Diagnosis present

## 2014-07-24 DIAGNOSIS — R112 Nausea with vomiting, unspecified: Secondary | ICD-10-CM | POA: Insufficient documentation

## 2014-07-24 DIAGNOSIS — Z3202 Encounter for pregnancy test, result negative: Secondary | ICD-10-CM | POA: Diagnosis not present

## 2014-07-24 DIAGNOSIS — R7989 Other specified abnormal findings of blood chemistry: Secondary | ICD-10-CM

## 2014-07-24 LAB — CBC WITH DIFFERENTIAL/PLATELET
Basophils Absolute: 0 10*3/uL (ref 0.0–0.1)
Basophils Relative: 0 % (ref 0–1)
Eosinophils Absolute: 0 10*3/uL (ref 0.0–0.7)
Eosinophils Relative: 0 % (ref 0–5)
HCT: 38 % (ref 36.0–46.0)
Hemoglobin: 12.4 g/dL (ref 12.0–15.0)
Lymphocytes Relative: 25 % (ref 12–46)
Lymphs Abs: 2.5 10*3/uL (ref 0.7–4.0)
MCH: 29.8 pg (ref 26.0–34.0)
MCHC: 32.6 g/dL (ref 30.0–36.0)
MCV: 91.3 fL (ref 78.0–100.0)
Monocytes Absolute: 0.8 10*3/uL (ref 0.1–1.0)
Monocytes Relative: 8 % (ref 3–12)
Neutro Abs: 6.6 10*3/uL (ref 1.7–7.7)
Neutrophils Relative %: 67 % (ref 43–77)
Platelets: 260 10*3/uL (ref 150–400)
RBC: 4.16 MIL/uL (ref 3.87–5.11)
RDW: 12.4 % (ref 11.5–15.5)
WBC: 10 10*3/uL (ref 4.0–10.5)

## 2014-07-24 LAB — COMPREHENSIVE METABOLIC PANEL
ALT: 105 U/L — ABNORMAL HIGH (ref 0–35)
AST: 121 U/L — ABNORMAL HIGH (ref 0–37)
Albumin: 3.7 g/dL (ref 3.5–5.2)
Alkaline Phosphatase: 30 U/L — ABNORMAL LOW (ref 39–117)
Anion gap: 11 (ref 5–15)
BUN: 9 mg/dL (ref 6–23)
CO2: 25 mEq/L (ref 19–32)
Calcium: 9.4 mg/dL (ref 8.4–10.5)
Chloride: 103 mEq/L (ref 96–112)
Creatinine, Ser: 0.78 mg/dL (ref 0.50–1.10)
GFR calc Af Amer: >90 mL/min (ref >90)
GFR calc non Af Amer: >90 mL/min (ref >90)
Glucose, Bld: 131 mg/dL — ABNORMAL HIGH (ref 70–99)
Potassium: 4.1 mEq/L (ref 3.7–5.3)
Sodium: 139 mEq/L (ref 137–147)
Total Bilirubin: 1.4 mg/dL — ABNORMAL HIGH (ref 0.3–1.2)
Total Protein: 7.3 g/dL (ref 6.0–8.3)

## 2014-07-24 LAB — PREGNANCY, URINE: Preg Test, Ur: NEGATIVE

## 2014-07-24 MED ORDER — ONDANSETRON HCL 4 MG PO TABS: 4.0000 mg | ORAL_TABLET | Freq: Four times a day (QID) | ORAL | Status: DC

## 2014-07-24 MED ORDER — OXYCODONE-ACETAMINOPHEN 5-325 MG PO TABS: 1.0000 | ORAL_TABLET | ORAL | Status: DC | PRN

## 2014-07-24 NOTE — ED Notes (Signed)
Pt reports ruq pain that radiates to rt flank and back. States pain has been occurring off and on since January when she had her son. Denies any urinary issues. C/o nausea, vomited x 1 today. Pain is intermittent. NAD. Respirations normal, unlabored. Pt alert, oriented.

## 2014-07-24 NOTE — ED Provider Notes (Signed)
CSN: [id number]     Arrival date & time 07/24/14  0148 History  This chart was scribed for Andrea RazorStephen Pankaj Haack, MD by Freida Busmaniana Omoyeni, ED Scribe. This patient was seen in room B16C/B16C and the patient's care was started 2:58 AM.    Chief Complaint  Patient presents with  . Abdominal Pain  . Back Pain     The history is provided by the patient. No language interpreter was used.     HPI Comments:  Andrea Rojas is a 23 y.o. female who presents to the Emergency Department complaining of mid upper back and RUQ abominal pain that started 2 days ago. She describes the pain as sharp. Pt reports a h/o similar pain since January 2015 with multiple episodes a month. She notes pain today has lasted longer than usual. She has been evaluated in the past for same by PCP and OB/GYN advised it may be associated with GERD or child birth; notes she gave birth in January 2015. She has taken pepto bismol without relief and notes pain is worse at night. She reports associated nausea and 1 episode of vomiting yesterday (07/23/2014). She denies fever, chills, urinary symptoms. She also denies a h/o abdominal surgeries and sour metallic taste in her mouth.     Past Medical History  Diagnosis Date  . Hx of varicella   . Vaginal Pap smear, abnormal    Past Surgical History  Procedure Laterality Date  . No past surgeries     Family History  Problem Relation Age of Onset  . Hyperlipidemia Father   . Alcohol abuse Neg Hx   . Arthritis Neg Hx   . Asthma Neg Hx   . Birth defects Neg Hx   . COPD Neg Hx   . Diabetes Neg Hx   . Depression Neg Hx   . Drug abuse Neg Hx   . Early death Neg Hx   . Hearing loss Neg Hx   . Heart disease Neg Hx   . Hypertension Neg Hx   . Kidney disease Neg Hx   . Learning disabilities Neg Hx   . Mental illness Neg Hx   . Mental retardation Neg Hx   . Miscarriages / Stillbirths Neg Hx   . Stroke Neg Hx   . Vision loss Neg Hx   . Cancer Cousin     breast   History  Substance Use  Topics  . Smoking status: Never Smoker   . Smokeless tobacco: Never Used  . Alcohol Use: No   OB History    Gravida Para Term Preterm AB TAB SAB Ectopic Multiple Living   2 1 1  1 1    1      Review of Systems  Constitutional: Negative for fever and chills.  Gastrointestinal: Positive for nausea, vomiting and abdominal pain.  Genitourinary: Negative for dysuria, urgency, frequency and difficulty urinating.  Musculoskeletal: Positive for back pain.  All other systems reviewed and are negative.     Allergies  Review of patient's allergies indicates no known allergies.  Home Medications   Prior to Admission medications   Medication Sig Start Date End Date Taking? Authorizing Provider  docusate sodium (COLACE) 100 MG capsule Take 1 capsule (100 mg total) by mouth 2 (two) times daily. 08/30/13   Kendra H. Tenny Crawoss, MD  ibuprofen (ADVIL,MOTRIN) 600 MG tablet Take 1 tablet (600 mg total) by mouth every 6 (six) hours as needed. 08/30/13   Kendra H. Tenny Crawoss, MD  oxyCODONE-acetaminophen (ROXICET) 5-325 MG per tablet  Take 2 tablets by mouth every 4 (four) hours as needed. May take 1-2 tablets every 4-6 hours as needed for pain 08/30/13   Kendra H. Tenny Crawoss, MD  Prenatal Vit-Fe Fumarate-FA (PRENATAL MULTIVITAMIN) TABS tablet Take 1 tablet by mouth daily at 12 noon.    Historical Provider, MD   BP 115/90 mmHg  Pulse 71  Temp(Src) 98.6 F (37 C) (Oral)  Resp 20  SpO2 97%  LMP 06/23/2014 Physical Exam  Constitutional: She appears well-developed and well-nourished. No distress.  HENT:  Head: Normocephalic and atraumatic.  Eyes: Conjunctivae are normal. Right eye exhibits no discharge. Left eye exhibits no discharge.  Neck: Neck supple.  Cardiovascular: Normal rate, regular rhythm and normal heart sounds.  Exam reveals no gallop and no friction rub.   No murmur heard. Pulmonary/Chest: Effort normal and breath sounds normal. No respiratory distress.  Abdominal: Soft. She exhibits no distension. There  is tenderness. There is no rebound and no guarding.  RUQ TTP  Genitourinary:  No CVA tenderness   Musculoskeletal: She exhibits no edema or tenderness.  Neurological: She is alert.  Skin: Skin is warm and dry.  Psychiatric: She has a normal mood and affect. Her behavior is normal. Thought content normal.  Nursing note and vitals reviewed.   ED Course  Procedures   DIAGNOSTIC STUDIES:  Oxygen Saturation is 97% on RA, normal by my interpretation.    COORDINATION OF CARE:  3:05 AM Discussed treatment plan with pt at bedside and pt agreed to plan.  Labs Review Labs Reviewed  COMPREHENSIVE METABOLIC PANEL - Abnormal; Notable for the following:    Glucose, Bld 131 (*)    AST 121 (*)    ALT 105 (*)    Alkaline Phosphatase 30 (*)    Total Bilirubin 1.4 (*)    All other components within normal limits  CBC WITH DIFFERENTIAL  PREGNANCY, URINE    Imaging Review No results found.   EKG Interpretation None      MDM   Final diagnoses:  RUQ abdominal pain  Abnormal LFTs   I personally preformed the services scribed in my presence. The recorded information has been reviewed is accurate. Andrea RazorStephen Denny Mccree, MD.    Andrea RazorStephen Makaylee Spielberg, MD 08/06/14 2012

## 2014-07-24 NOTE — ED Notes (Signed)
Pt. reports chronic RUQ pain and right back pain with emesis since Jan.2015 , denies fever or chills.

## 2014-07-24 NOTE — Discharge Instructions (Signed)
Biliary Colic  °Biliary colic is a steady or irregular pain in the upper abdomen. It is usually under the right side of the rib cage. It happens when gallstones interfere with the normal flow of bile from the gallbladder. Bile is a liquid that helps to digest fats. Bile is made in the liver and stored in the gallbladder. When you eat a meal, bile passes from the gallbladder through the cystic duct and the common bile duct into the small intestine. There, it mixes with partially digested food. If a gallstone blocks either of these ducts, the normal flow of bile is blocked. The muscle cells in the bile duct contract forcefully to try to move the stone. This causes the pain of biliary colic.  °SYMPTOMS  °· A person with biliary colic usually complains of pain in the upper abdomen. This pain can be: °¨ In the center of the upper abdomen just below the breastbone. °¨ In the upper-right part of the abdomen, near the gallbladder and liver. °¨ Spread back toward the right shoulder blade. °· Nausea and vomiting. °· The pain usually occurs after eating. °· Biliary colic is usually triggered by the digestive system's demand for bile. The demand for bile is high after fatty meals. Symptoms can also occur when a person who has been fasting suddenly eats a very large meal. Most episodes of biliary colic pass after 1 to 5 hours. After the most intense pain passes, your abdomen may continue to ache mildly for about 24 hours. °DIAGNOSIS  °After you describe your symptoms, your caregiver will perform a physical exam. He or she will pay attention to the upper right portion of your belly (abdomen). This is the area of your liver and gallbladder. An ultrasound will help your caregiver look for gallstones. Specialized scans of the gallbladder may also be done. Blood tests may be done, especially if you have fever or if your pain persists. °PREVENTION  °Biliary colic can be prevented by controlling the risk factors for gallstones. Some of  these risk factors, such as heredity, increasing age, and pregnancy are a normal part of life. Obesity and a high-fat diet are risk factors you can change through a healthy lifestyle. Women going through menopause who take hormone replacement therapy (estrogen) are also more likely to develop biliary colic. °TREATMENT  °· Pain medication may be prescribed. °· You may be encouraged to eat a fat-free diet. °· If the first episode of biliary colic is severe, or episodes of colic keep retuning, surgery to remove the gallbladder (cholecystectomy) is usually recommended. This procedure can be done through small incisions using an instrument called a laparoscope. The procedure often requires a brief stay in the hospital. Some people can leave the hospital the same day. It is the most widely used treatment in people troubled by painful gallstones. It is effective and safe, with no complications in more than 90% of cases. °· If surgery cannot be done, medication that dissolves gallstones may be used. This medication is expensive and can take months or years to work. Only small stones will dissolve. °· Rarely, medication to dissolve gallstones is combined with a procedure called shock-wave lithotripsy. This procedure uses carefully aimed shock waves to break up gallstones. In many people treated with this procedure, gallstones form again within a few years. °PROGNOSIS  °If gallstones block your cystic duct or common bile duct, you are at risk for repeated episodes of biliary colic. There is also a 25% chance that you will develop   a gallbladder infection(acute cholecystitis), or some other complication of gallstones within 10 to 20 years. If you have surgery, schedule it at a time that is convenient for you and at a time when you are not sick. °HOME CARE INSTRUCTIONS  °· Drink plenty of clear fluids. °· Avoid fatty, greasy or fried foods, or any foods that make your pain worse. °· Take medications as directed. °SEEK MEDICAL  CARE IF:  °· You develop a fever over 100.5° F (38.1° C). °· Your pain gets worse over time. °· You develop nausea that prevents you from eating and drinking. °· You develop vomiting. °SEEK IMMEDIATE MEDICAL CARE IF:  °· You have continuous or severe belly (abdominal) pain which is not relieved with medications. °· You develop nausea and vomiting which is not relieved with medications. °· You have symptoms of biliary colic and you suddenly develop a fever and shaking chills. This may signal cholecystitis. Call your caregiver immediately. °· You develop a yellow color to your skin or the white part of your eyes (jaundice). °Document Released: 12/26/2005 Document Revised: 10/17/2011 Document Reviewed: 03/06/2008 °ExitCare® Patient Information ©2015 [employer]. This information is not intended to replace advice given to you by your health care provider. Make sure you discuss any questions you have with your health care provider. ° °

## 2014-07-29 ENCOUNTER — Other Ambulatory Visit: Payer: Self-pay | Admitting: Family Medicine

## 2014-07-29 DIAGNOSIS — R1011 Right upper quadrant pain: Secondary | ICD-10-CM

## 2014-07-30 ENCOUNTER — Ambulatory Visit
Admission: RE | Admit: 2014-07-30 | Discharge: 2014-07-30 | Disposition: A | Discharge status: Home / Self Care | Payer: PRIVATE HEALTH INSURANCE | Source: Ambulatory Visit | Attending: Family Medicine | Admitting: Family Medicine

## 2014-07-30 DIAGNOSIS — R1011 Right upper quadrant pain: Secondary | ICD-10-CM

## 2018-06-12 ENCOUNTER — Other Ambulatory Visit: Payer: Self-pay | Admitting: Family Medicine

## 2018-06-12 ENCOUNTER — Other Ambulatory Visit (HOSPITAL_COMMUNITY)
Admission: RE | Admit: 2018-06-12 | Discharge: 2018-06-12 | Disposition: A | Discharge status: Home / Self Care | Payer: PRIVATE HEALTH INSURANCE | Source: Ambulatory Visit | Attending: Family Medicine | Admitting: Family Medicine

## 2018-06-12 DIAGNOSIS — Z124 Encounter for screening for malignant neoplasm of cervix: Secondary | ICD-10-CM | POA: Diagnosis not present

## 2018-06-15 LAB — CYTOLOGY - PAP
CHLAMYDIA, DNA PROBE: NEGATIVE
DIAGNOSIS: NEGATIVE
Diagnosis: REACTIVE
HPV (WINDOPATH): NOT DETECTED
Neisseria Gonorrhea: NEGATIVE

## 2019-02-12 ENCOUNTER — Other Ambulatory Visit: Payer: Self-pay | Admitting: *Deleted

## 2019-02-12 DIAGNOSIS — Z20822 Contact with and (suspected) exposure to covid-19: Secondary | ICD-10-CM

## 2019-02-18 LAB — NOVEL CORONAVIRUS, NAA: SARS-CoV-2, NAA: NOT DETECTED

## 2019-03-05 ENCOUNTER — Other Ambulatory Visit: Payer: Self-pay | Admitting: *Deleted

## 2019-03-05 DIAGNOSIS — Z20822 Contact with and (suspected) exposure to covid-19: Secondary | ICD-10-CM

## 2019-03-06 ENCOUNTER — Other Ambulatory Visit: Payer: Self-pay

## 2019-03-08 LAB — NOVEL CORONAVIRUS, NAA: SARS-CoV-2, NAA: NOT DETECTED

## 2019-12-12 ENCOUNTER — Ambulatory Visit: Payer: PRIVATE HEALTH INSURANCE | Attending: Internal Medicine

## 2019-12-12 DIAGNOSIS — Z23 Encounter for immunization: Secondary | ICD-10-CM

## 2019-12-12 NOTE — Progress Notes (Signed)
   Covid-19 Vaccination Clinic  Name:  Andrea Rojas    MRN: [id number] DOB: 11-22-1990  12/12/2019  [name] was observed post Covid-19 immunization for 15 minutes without incident. She was provided with Vaccine Information Sheet and instruction to access the V-Safe system.   [name] was instructed to call 911 with any severe reactions post vaccine: Marland Kitchen Difficulty breathing  . Swelling of face and throat  . A fast heartbeat  . A bad rash all over body  . Dizziness and weakness   Immunizations Administered    Name Date Dose VIS Date Route   Pfizer COVID-19 Vaccine 12/12/2019  4:33 PM 0.3 mL 10/02/2018 Intramuscular   Manufacturer: [employer], [employer]   Lot: [id number]   NDC: [address]-7

## 2019-12-20 ENCOUNTER — Other Ambulatory Visit: Payer: Self-pay | Admitting: *Deleted

## 2019-12-20 DIAGNOSIS — Z1231 Encounter for screening mammogram for malignant neoplasm of breast: Secondary | ICD-10-CM

## 2019-12-24 ENCOUNTER — Ambulatory Visit
Admission: RE | Admit: 2019-12-24 | Discharge: 2019-12-24 | Disposition: A | Discharge status: Home / Self Care | Payer: PRIVATE HEALTH INSURANCE | Source: Ambulatory Visit | Attending: *Deleted | Admitting: *Deleted

## 2019-12-24 ENCOUNTER — Other Ambulatory Visit: Payer: Self-pay

## 2019-12-24 DIAGNOSIS — Z1231 Encounter for screening mammogram for malignant neoplasm of breast: Secondary | ICD-10-CM

## 2020-01-07 ENCOUNTER — Ambulatory Visit: Payer: Self-pay | Attending: Internal Medicine

## 2020-01-07 DIAGNOSIS — Z23 Encounter for immunization: Secondary | ICD-10-CM

## 2020-01-07 NOTE — Progress Notes (Signed)
° °  Covid-19 Vaccination Clinic  Name:  [name]    MRN: [id number] DOB: May 04, 1991  01/07/2020  [name] was observed post Covid-19 immunization for 15 minutes without incident. She was provided with Vaccine Information Sheet and instruction to access the V-Safe system.   [name] was instructed to call 911 with any severe reactions post vaccine:  Difficulty breathing   Swelling of face and throat   A fast heartbeat   A bad rash all over body   Dizziness and weakness   Immunizations Administered    Name Date Dose VIS Date Route   Pfizer COVID-19 Vaccine 01/07/2020  4:30 PM 0.3 mL 10/02/2018 Intramuscular   Manufacturer: [employer], [employer]   Lot: [id number]   NDC: [address]-6

## 2020-07-22 ENCOUNTER — Ambulatory Visit (HOSPITAL_COMMUNITY)
Admission: EM | Admit: 2020-07-22 | Discharge: 2020-07-22 | Disposition: A | Discharge status: Home / Self Care | Payer: Managed Care, Other (non HMO) | Attending: Emergency Medicine | Admitting: Emergency Medicine

## 2020-07-22 ENCOUNTER — Other Ambulatory Visit: Payer: Self-pay

## 2020-07-22 ENCOUNTER — Encounter (HOSPITAL_COMMUNITY): Payer: Self-pay

## 2020-07-22 DIAGNOSIS — S39012A Strain of muscle, fascia and tendon of lower back, initial encounter: Secondary | ICD-10-CM

## 2020-07-22 MED ORDER — IBUPROFEN 600 MG PO TABS: 600.0000 mg | ORAL_TABLET | Freq: Four times a day (QID) | ORAL | 0 refills | Status: DC | PRN

## 2020-07-22 MED ORDER — TIZANIDINE HCL 4 MG PO TABS: 4.0000 mg | ORAL_TABLET | Freq: Three times a day (TID) | ORAL | 0 refills | Status: DC | PRN
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
## 2020-07-22 NOTE — Discharge Instructions (Addendum)
People tend to feel worse over the next several days, but most people are back to normal in 1 week. A small number of people will have persistent pain for up to six weeks. Take the 600 mg of ibuprofen combined with 1000 mg of Tylenol together 3-4 times a day as needed for pain.  Zanaflex is for muscle spasms.   Go to www.goodrx.com to look up your medications. This will give you a list of where you can find your prescriptions at the most affordable prices. Or ask the pharmacist what the cash price is, or if they have any other discount programs available to help make your medication more affordable. This can be less expensive than what you would pay with insurance.

## 2020-09-21 ENCOUNTER — Other Ambulatory Visit: Payer: Self-pay

## 2020-09-21 ENCOUNTER — Ambulatory Visit
Admission: EM | Admit: 2020-09-21 | Discharge: 2020-09-21 | Disposition: A | Discharge status: Home / Self Care | Payer: Managed Care, Other (non HMO) | Attending: Physician Assistant | Admitting: Physician Assistant

## 2020-09-21 ENCOUNTER — Encounter: Payer: Self-pay | Admitting: Emergency Medicine

## 2020-09-21 DIAGNOSIS — R1011 Right upper quadrant pain: Secondary | ICD-10-CM | POA: Diagnosis not present

## 2020-09-21 MED ORDER — OXYCODONE-ACETAMINOPHEN 5-325 MG PO TABS: 1.0000 | ORAL_TABLET | ORAL | 0 refills | Status: DC | PRN

## 2020-09-21 NOTE — ED Provider Notes (Signed)
[facility]    CSN: [id number] Arrival date & time: 09/21/20  1421      History   Chief Complaint Chief Complaint  Patient presents with  . Back Pain  . Abdominal Pain    HPI Andrea Rojas is a 30 y.o. female.   The history is provided by the patient. No language interpreter was used.  Abdominal Pain Pain location:  RUQ Pain quality: aching   Pain radiates to:  Does not radiate Pain severity:  Moderate Onset quality:  Gradual Duration:  1 week Timing:  Constant Progression:  Worsening Chronicity:  New Relieved by:  Nothing Worsened by:  Nothing Ineffective treatments:  None tried Associated symptoms: nausea     Past Medical History:  Diagnosis Date  . Hx of varicella   . Vaginal Pap smear, abnormal     Patient Active Problem List   Diagnosis Date Noted  . Postpartum state 08/29/2013  . Labor and delivery, indication for care 08/28/2013    Past Surgical History:  Procedure Laterality Date  . NO PAST SURGERIES      OB History    Gravida  2   Para  1   Term  1   Preterm      AB  1   Living  1     SAB      IAB  1   Ectopic      Multiple      Live Births  1            Home Medications    Prior to Admission medications   Medication Sig Start Date End Date Taking? Authorizing Provider  oxyCODONE-acetaminophen (PERCOCET) 5-325 MG tablet Take 1 tablet by mouth every 4 (four) hours as needed for severe pain. 09/21/20 09/21/21 Yes Elson Areas, PA-C  oxyCODONE-acetaminophen (PERCOCET) 5-325 MG tablet Take 1 tablet by mouth every 4 (four) hours as needed for severe pain. 09/21/20 09/21/21  Elson Areas, PA-C    Family History Family History  Problem Relation Age of Onset  . Hyperlipidemia Father   . Cancer Cousin        breast  . Alcohol abuse Neg Hx   . Arthritis Neg Hx   . Asthma Neg Hx   . Birth defects Neg Hx   . COPD Neg Hx   . Diabetes Neg Hx   . Depression Neg Hx   . Drug abuse Neg Hx   . Early death Neg  Hx   . Hearing loss Neg Hx   . Heart disease Neg Hx   . Hypertension Neg Hx   . Kidney disease Neg Hx   . Learning disabilities Neg Hx   . Mental illness Neg Hx   . Mental retardation Neg Hx   . Miscarriages / Stillbirths Neg Hx   . Stroke Neg Hx   . Vision loss Neg Hx     Social History Social History   Tobacco Use  . Smoking status: Never Smoker  . Smokeless tobacco: Never Used  Substance Use Topics  . Alcohol use: No  . Drug use: No     Allergies   Patient has no known allergies.   Review of Systems Review of Systems  Gastrointestinal: Positive for abdominal pain and nausea.  All other systems reviewed and are negative.    Physical Exam Triage Vital Signs ED Triage Vitals  Enc Vitals Group     BP 09/21/20 1504 (!) 151/89     Pulse Rate  09/21/20 1504 70     Resp 09/21/20 1504 17     Temp 09/21/20 1504 98.1 F (36.7 C)     Temp Source 09/21/20 1504 Oral     SpO2 09/21/20 1504 97 %     Weight --      Height --      Head Circumference --      Peak Flow --      Pain Score 09/21/20 1501 10     Pain Loc --      Pain Edu? --      Excl. in GC? --    No data found.  Updated Vital Signs BP (!) 151/89 (BP Location: Left Arm)   Pulse 70   Temp 98.1 F (36.7 C) (Oral)   Resp 17   LMP 09/08/2020   SpO2 97%   Breastfeeding No   Visual Acuity Right Eye Distance:   Left Eye Distance:   Bilateral Distance:    Right Eye Near:   Left Eye Near:    Bilateral Near:     Physical Exam Vitals and nursing note reviewed.  Constitutional:      Appearance: She is well-developed and well-nourished.  HENT:     Head: Normocephalic.  Eyes:     Extraocular Movements: EOM normal.  Cardiovascular:     Rate and Rhythm: Normal rate and regular rhythm.  Pulmonary:     Effort: Pulmonary effort is normal.  Abdominal:     General: Bowel sounds are normal. There is no distension.     Palpations: Abdomen is soft.  Musculoskeletal:        General: Normal range of  motion.     Cervical back: Normal range of motion.  Skin:    General: Skin is warm.  Neurological:     General: No focal deficit present.     Mental Status: She is alert and oriented to person, place, and time.  Psychiatric:        Mood and Affect: Mood and affect and mood normal.      UC Treatments / Results  Labs (all labs ordered are listed, but only abnormal results are displayed) Labs Reviewed - No data to display  EKG   Radiology No results found.  Procedures Procedures (including critical care time)  Medications Ordered in UC Medications - No data to display  Initial Impression / Assessment and Plan / UC Course  I have reviewed the triage vital signs and the nursing notes.  Pertinent labs & imaging results that were available during my care of the patient were reviewed by me and considered in my medical decision making (see chart for details).     MDM: Pt has a history of gallstones.  I suspect pt is having gallbladder attacks.  Pt given an rx for pain medication.  Pt advised to call Dr. Mosetta Anis office to schedule appointment.  Go to ED if symptoms persist or worsen  Final Clinical Impressions(s) / UC Diagnoses   Final diagnoses:  Right upper quadrant abdominal pain   Discharge Instructions   None    ED Prescriptions    Medication Sig Dispense Auth. Provider   oxyCODONE-acetaminophen (PERCOCET) 5-325 MG tablet  (Status: Discontinued) Take 1 tablet by mouth every 4 (four) hours as needed for severe pain. 16 tablet Sofia, Leslie K, [place]   oxyCODONE-acetaminophen (PERCOCET) 5-325 MG tablet Take 1 tablet by mouth every 4 (four) hours as needed for severe pain. 16 tablet Cheron Schaumann K, [place]   oxyCODONE-acetaminophen (  PERCOCET) 5-325 MG tablet Take 1 tablet by mouth every 4 (four) hours as needed for severe pain. 16 tablet Elson Areas, [place]     PDMP not reviewed this encounter.  An After Visit Summary was printed and given to the patient.    Elson Areas, [place] 09/21/20 [id number]

## 2020-09-21 NOTE — ED Triage Notes (Signed)
Pt states that she is having abdominal pain and back pain. Pt states that she has a hx of gallstones and feels similar. Pt states sx started last week.

## 2020-09-24 ENCOUNTER — Other Ambulatory Visit: Payer: Self-pay | Admitting: Family Medicine

## 2020-09-24 DIAGNOSIS — R1011 Right upper quadrant pain: Secondary | ICD-10-CM

## 2020-09-29 ENCOUNTER — Other Ambulatory Visit: Payer: Self-pay

## 2020-09-29 ENCOUNTER — Ambulatory Visit
Admission: RE | Admit: 2020-09-29 | Discharge: 2020-09-29 | Disposition: A | Discharge status: Home / Self Care | Payer: Managed Care, Other (non HMO) | Source: Ambulatory Visit | Attending: Family Medicine | Admitting: Family Medicine

## 2020-09-29 DIAGNOSIS — R1011 Right upper quadrant pain: Secondary | ICD-10-CM

## 2021-03-05 ENCOUNTER — Emergency Department (HOSPITAL_COMMUNITY): Payer: Managed Care, Other (non HMO)

## 2021-03-05 ENCOUNTER — Encounter (HOSPITAL_COMMUNITY): Payer: Self-pay

## 2021-03-05 ENCOUNTER — Emergency Department (HOSPITAL_COMMUNITY)
Admission: EM | Admit: 2021-03-05 | Discharge: 2021-03-05 | Disposition: A | Discharge status: Home / Self Care | Payer: Managed Care, Other (non HMO) | Attending: Emergency Medicine | Admitting: Emergency Medicine

## 2021-03-05 ENCOUNTER — Other Ambulatory Visit: Payer: Self-pay

## 2021-03-05 DIAGNOSIS — R5383 Other fatigue: Secondary | ICD-10-CM | POA: Diagnosis not present

## 2021-03-05 DIAGNOSIS — N9489 Other specified conditions associated with female genital organs and menstrual cycle: Secondary | ICD-10-CM | POA: Diagnosis not present

## 2021-03-05 DIAGNOSIS — N3000 Acute cystitis without hematuria: Secondary | ICD-10-CM | POA: Diagnosis not present

## 2021-03-05 DIAGNOSIS — R531 Weakness: Secondary | ICD-10-CM | POA: Insufficient documentation

## 2021-03-05 DIAGNOSIS — R1011 Right upper quadrant pain: Secondary | ICD-10-CM | POA: Diagnosis present

## 2021-03-05 DIAGNOSIS — K8020 Calculus of gallbladder without cholecystitis without obstruction: Secondary | ICD-10-CM | POA: Insufficient documentation

## 2021-03-05 DIAGNOSIS — Z8719 Personal history of other diseases of the digestive system: Secondary | ICD-10-CM | POA: Insufficient documentation

## 2021-03-05 LAB — CBC
HCT: 40.1 % (ref 36.0–46.0)
Hemoglobin: 12.8 g/dL (ref 12.0–15.0)
MCH: 30.8 pg (ref 26.0–34.0)
MCHC: 31.9 g/dL (ref 30.0–36.0)
MCV: 96.4 fL (ref 80.0–100.0)
Platelets: 271 10*3/uL (ref 150–400)
RBC: 4.16 MIL/uL (ref 3.87–5.11)
RDW: 12.7 % (ref 11.5–15.5)
WBC: 6 10*3/uL (ref 4.0–10.5)
nRBC: 0 % (ref 0.0–0.2)

## 2021-03-05 LAB — COMPREHENSIVE METABOLIC PANEL
ALT: 284 U/L — ABNORMAL HIGH (ref 0–44)
AST: 129 U/L — ABNORMAL HIGH (ref 15–41)
Albumin: 4.1 g/dL (ref 3.5–5.0)
Alkaline Phosphatase: 57 U/L (ref 38–126)
Anion gap: 8 (ref 5–15)
BUN: 11 mg/dL (ref 6–20)
CO2: 26 mmol/L (ref 22–32)
Calcium: 9.4 mg/dL (ref 8.9–10.3)
Chloride: 105 mmol/L (ref 98–111)
Creatinine, Ser: 1.06 mg/dL — ABNORMAL HIGH (ref 0.44–1.00)
GFR, Estimated: >60 mL/min (ref >60)
Glucose, Bld: 77 mg/dL (ref 70–99)
Potassium: 3.6 mmol/L (ref 3.5–5.1)
Sodium: 139 mmol/L (ref 135–145)
Total Bilirubin: 1.1 mg/dL (ref 0.3–1.2)
Total Protein: 7.4 g/dL (ref 6.5–8.1)

## 2021-03-05 LAB — URINALYSIS, ROUTINE W REFLEX MICROSCOPIC
Bilirubin Urine: NEGATIVE
Glucose, UA: NEGATIVE mg/dL
Ketones, ur: NEGATIVE mg/dL
Nitrite: NEGATIVE
Protein, ur: NEGATIVE mg/dL
Specific Gravity, Urine: 1.004 — ABNORMAL LOW (ref 1.005–1.030)
pH: 6 (ref 5.0–8.0)

## 2021-03-05 LAB — I-STAT BETA HCG BLOOD, ED (MC, WL, AP ONLY): I-stat hCG, quantitative: <5 m[IU]/mL (ref <5)

## 2021-03-05 LAB — LIPASE, BLOOD: Lipase: 36 U/L (ref 11–51)

## 2021-03-05 MED ORDER — SODIUM CHLORIDE 0.9 % IV BOLUS
1000.0000 mL | Freq: Once | INTRAVENOUS | Status: AC
  Administered 2021-03-05: 1000 mL via INTRAVENOUS

## 2021-03-05 MED ORDER — CEPHALEXIN 500 MG PO CAPS: 500.0000 mg | ORAL_CAPSULE | Freq: Two times a day (BID) | ORAL | 0 refills | Status: AC

## 2021-03-05 NOTE — ED Triage Notes (Signed)
Pt reports LUQ discomfort, nausea, and fatigue for awhile now. Pt is supposed to get her gall bladder out, but has not eat. Pt also endorses noticing some jaundice to her eyes. Pt states that her surgeon sent her here for evaluation.

## 2021-03-05 NOTE — Discharge Instructions (Addendum)
Please follow up with Dr. Derrell Lolling in the outpatient setting for further evaluation of your gallstones  Pick up antibiotics and take as prescribed to cover for a urinary tract infection. We have sent your urine for culture and will call you in 2-3 days time IF the antibiotic needs to be changed. Follow up with your PCP and have your urine rechecked in 1-2 weeks to ensure the infection has cleared.   Return to the ED IMMEDIATELY for any new/worsening symptoms including worsening pain, fevers > 100.4 with pain, or any other concerning symptoms

## 2021-03-05 NOTE — ED Provider Notes (Signed)
[facility] DEPT Provider Note   CSN: [id number] Arrival date & time: 03/05/21  1251     History Chief Complaint  Patient presents with   Abdominal Pain    Andrea Rojas is a 30 y.o. female who presents to the ED today with complaint of biliary colic pain that began about 1 week ago while on vacation. Pt has hx of gallstones with intermittent biliary colic for several years. She follows with Dr. Rosendo Gros with [facility]. She reports that she had a gallbladder attacked while on vacation last week and went to an outside ED. She had labs done and was discharged with plans to follow up with general surgery. Pt reports she had on and off pain all week however it seemed to resolve yesterday. She has noticed that her eyes appear more yellow to her. She also complains of dark colored urine, white colored stool, and generalized weakness/fatigue. She called the surgical office today and was advised to come to the ED for further eval. Pt denies fevers, chills. She did have nausea and NBNB emesis earlier in the week however this has resolved as well. Just finished her menstrual cycle yesterday.   The history is provided by the patient and medical records.      Past Medical History:  Diagnosis Date   Hx of varicella    Vaginal Pap smear, abnormal     Patient Active Problem List   Diagnosis Date Noted   Postpartum state 08/29/2013   Labor and delivery, indication for care 08/28/2013    Past Surgical History:  Procedure Laterality Date   NO PAST SURGERIES       OB History     Gravida  2   Para  1   Term  1   Preterm      AB  1   Living  1      SAB      IAB  1   Ectopic      Multiple      Live Births  1           Family History  Problem Relation Age of Onset   Hyperlipidemia Father    Cancer Cousin        breast   Alcohol abuse Neg Hx    Arthritis Neg Hx    Asthma Neg Hx    Birth defects Neg Hx    COPD Neg Hx     Diabetes Neg Hx    Depression Neg Hx    Drug abuse Neg Hx    Early death Neg Hx    Hearing loss Neg Hx    Heart disease Neg Hx    Hypertension Neg Hx    Kidney disease Neg Hx    Learning disabilities Neg Hx    Mental illness Neg Hx    Mental retardation Neg Hx    Miscarriages / Stillbirths Neg Hx    Stroke Neg Hx    Vision loss Neg Hx     Social History   Tobacco Use   Smoking status: Never   Smokeless tobacco: Never  Substance Use Topics   Alcohol use: No   Drug use: No    Home Medications Prior to Admission medications   Medication Sig Start Date End Date Taking? Authorizing Provider  cephALEXin (KEFLEX) 500 MG capsule Take 1 capsule (500 mg total) by mouth 2 (two) times daily for 5 days. 03/05/21 03/10/21 Yes Caryle Helgeson, PA-C  oxyCODONE-acetaminophen (PERCOCET) [phone]  MG tablet Take 1 tablet by mouth every 4 (four) hours as needed for severe pain. 09/21/20 09/21/21  Fransico Meadow, PA-C  oxyCODONE-acetaminophen (PERCOCET) 5-325 MG tablet Take 1 tablet by mouth every 4 (four) hours as needed for severe pain. 09/21/20 09/21/21  Fransico Meadow, PA-C    Allergies    Patient has no known allergies.  Review of Systems   Review of Systems  Constitutional:  Negative for chills and fever.  Gastrointestinal:  Positive for abdominal pain (resolved), nausea and vomiting (resolved). Negative for diarrhea.  Genitourinary:        + dark colored urine  Neurological:  Positive for weakness (generalized).  All other systems reviewed and are negative.  Physical Exam Updated Vital Signs BP (!) 146/97   Pulse 65   Temp 98.4 F (36.9 C) (Oral)   Resp 17   SpO2 100%   Physical Exam Vitals and nursing note reviewed.  Constitutional:      Appearance: She is not ill-appearing or diaphoretic.  HENT:     Head: Normocephalic and atraumatic.  Eyes:     General: No scleral icterus.    Conjunctiva/sclera: Conjunctivae normal.  Cardiovascular:     Rate and Rhythm: Normal rate and  regular rhythm.  Pulmonary:     Effort: Pulmonary effort is normal.     Breath sounds: Normal breath sounds. No wheezing, rhonchi or rales.  Abdominal:     General: Bowel sounds are normal.     Palpations: Abdomen is soft.     Tenderness: There is no abdominal tenderness. There is no guarding or rebound.  Musculoskeletal:     Cervical back: Neck supple.  Skin:    General: Skin is warm and dry.  Neurological:     Mental Status: She is alert.    ED Results / Procedures / Treatments   Labs (all labs ordered are listed, but only abnormal results are displayed) Labs Reviewed  COMPREHENSIVE METABOLIC PANEL - Abnormal; Notable for the following components:      Result Value   Creatinine, Ser 1.06 (*)    AST 129 (*)    ALT 284 (*)    All other components within normal limits  URINALYSIS, ROUTINE W REFLEX MICROSCOPIC - Abnormal; Notable for the following components:   APPearance HAZY (*)    Specific Gravity, Urine 1.004 (*)    Hgb urine dipstick LARGE (*)    Leukocytes,Ua SMALL (*)    Bacteria, UA RARE (*)    All other components within normal limits  URINE CULTURE  LIPASE, BLOOD  CBC  I-STAT BETA HCG BLOOD, ED ([facility], [facility], [facility] ONLY)    EKG None  Radiology US Abdomen Limited RUQ (LIVER/GB)  Result Date: 03/05/2021 CLINICAL DATA:  Right upper quadrant pain. EXAM: ULTRASOUND ABDOMEN LIMITED RIGHT UPPER QUADRANT COMPARISON:  09/29/2020 FINDINGS: Gallbladder: Wall-echo-shadow complex is seen in the gallbladder fossa, consistent with gallstones filling the gallbladder lumen. This limits visualization of the gallbladder wall. The nondependent gallbladder wall shows borderline thickening measuring 3-4 mm, however no sonographic Murphy sign noted by sonographer. Common bile duct: Diameter: 5 mm, within normal limits. Liver: No focal lesion identified. Within normal limits in parenchymal echogenicity. Portal vein is patent on color Doppler imaging with normal direction of blood flow towards the  liver. Other: None. IMPRESSION: Cholelithiasis. Borderline gallbladder wall thickening, however no sonographic Murphy sign noted by sonographer. Consider nuclear medicine hepatobiliary scan for further evaluation if clinically warranted. No evidence of biliary ductal dilatation. Electronically Signed  By: Marlaine Hind M.D.   On: 03/05/2021 14:58    Procedures Procedures   Medications Ordered in ED Medications  sodium chloride 0.9 % bolus 1,000 mL (0 mLs Intravenous Stopped 03/05/21 1951)    ED Course  I have reviewed the triage vital signs and the nursing notes.  Pertinent labs & imaging results that were available during my care of the patient were reviewed by me and considered in my medical decision making (see chart for details).    MDM Rules/Calculators/A&P                           30 year old female presents to the ED today with complaint of intermittent right upper quadrant abdominal pain for the past week.  History of biliary colic, follows with Dr. Rosendo Gros.  Advised to come to the ED today for further evaluation.  Her pain stopped yesterday however she does complain of continued nausea and generalized weakness.  Feels like her eyes are more yellow than normal.  On arrival to the ED today vitals are stable.  She had work-up done while in the waiting room including screening labs and right upper quadrant ultrasound.  BC without leukocytosis and hemoglobin stable at 12.8.  CMP with a slightly elevated creatinine at 1.06.  AST and ALT elevated 129/284.  T bili 1.1.  Alk phos within normal limits.  Lipase 36.  Beta-hCG negative.  UA with large hemoglobin on dipstick.  Small leuks, 6 to 10 white blood cells per high-power field, rare bacteria.  RUQ ultrasound:  IMPRESSION:  Cholelithiasis.     Borderline gallbladder wall thickening, however no sonographic  Murphy sign noted by sonographer. Consider nuclear medicine  hepatobiliary scan for further evaluation if clinically warranted.      No evidence of biliary ductal dilatation.   On my exam patient is sitting upright in a chair, appears comfortable on exam.  He has no obvious abdominal tenderness palpation.  No obvious scleral icterus.  She does have some dry mucous membranes.  Will provide fluids at this time due to elevated kidney function and dehydration.  We will send urine for culture, she does endorse some urinary frequency earlier in the week.  Question if this is why her urine has been darker than normal.  We will plan to also touch base with general surgery given the advised patient to come to the ED today.  Her gallbladder does not appear infected and given her overall comfortable appearance I do not feel she is likely a candidate for emergent cholecystectomy at this time.  Discussed case with general surgeon Dr. Marcello Moores, recommends outpatient follow up at this time.   Pt stable for discharge at this time. Will discharge with abx for UTI. Pt instructed to have urine rechecked in 1-2 weeks. She is advised to follow up with Dr. Rosendo Gros in the outpatient setting.   This note was prepared using Dragon voice recognition software and may include unintentional dictation errors due to the inherent limitations of voice recognition software.   Final Clinical Impression(s) / ED Diagnoses Final diagnoses:  RUQ pain  Calculus of gallbladder without cholecystitis without obstruction  Acute cystitis without hematuria    Rx / DC Orders ED Discharge Orders          Ordered    cephALEXin (KEFLEX) 500 MG capsule  2 times daily        03/05/21 2021  Discharge Instructions      Please follow up with Dr. Rosendo Gros in the outpatient setting for further evaluation of your gallstones  Pick up antibiotics and take as prescribed to cover for a urinary tract infection. We have sent your urine for culture and will call you in 2-3 days time IF the antibiotic needs to be changed. Follow up with your PCP and have your urine  rechecked in 1-2 weeks to ensure the infection has cleared.   Return to the ED IMMEDIATELY for any new/worsening symptoms including worsening pain, fevers > 100.4 with pain, or any other concerning symptoms       Eustaquio Maize, PA-C 03/05/21 2023    Regan Lemming, MD 03/05/21 2357

## 2021-03-05 NOTE — ED Provider Notes (Signed)
Emergency Medicine Provider Triage Evaluation Note  Andrea Rojas , a 30 y.o. female  was evaluated in triage.  Pt complains of right upper quadrant abdominal pain.  She has known gallstones and has had episodes of biliary colic on and off for at least 1 year.  She states that most of the time it goes away on its own however she has had severe right upper quadrant pain change in the color of her urine, nausea and a 13 pound weight loss over the past week while she was on vacation out of state and had chronic severe pain the entire time.  She currently does not have any severe pain but is very concerned with her symptoms.  She feels weak and tired but denies any active nausea or vomiting.  She feels as though her eyes have yellowed.  When she has the pain it is colicky, severe, radiates to her back and right shoulder.  She has not had any vomiting since this past Sunday.  Review of Systems  Positive: Right upper quadrant pain Negative: Fever  Physical Exam  BP (!) 163/100 (BP Location: Left Arm)   Pulse 86   Temp 98.4 F (36.9 C) (Oral)   Resp 18   SpO2 99%  Gen:   Awake, no distress   Resp:  Normal effort  MSK:   Moves extremities without difficulty  Other:  No icterus  Medical Decision Making  Medically screening exam initiated at 1:28 PM.  Appropriate orders placed.  Denyse Fillion was informed that the remainder of the evaluation will be completed by another provider, this initial triage assessment does not replace that evaluation, and the importance of remaining in the ED until their evaluation is complete.  Patient with concern for biliary colic. Orders and imaging placed.   Arthor Captain, PA-C 03/05/21 1351    Terald Sleeper, MD 03/05/21 1745

## 2021-03-07 LAB — URINE CULTURE: Culture: <10000 — AB

## 2021-09-03 NOTE — Progress Notes (Addendum)
Surgical Instructions    Your procedure is scheduled on 09/09/21.  Report to [facility] Main Entrance "A" at 10:30 A.M., then check in with the Admitting office.  Call this number if you have problems the morning of surgery:  [phone]   If you have any questions prior to your surgery date call [phone]: Open Monday-Friday 8am-4pm    Remember:  Do not eat after midnight the night before your surgery  You may drink clear liquids until 9:30 the morning of your surgery.   Clear liquids allowed are: Water, Non-Citrus Juices (without pulp), Carbonated Beverages, Clear Tea, Black Coffee ONLY (NO MILK, CREAM OR POWDERED CREAMER of any kind), and Gatorade    Take these medicines the morning of surgery with A SIP OF WATER:  NONE     As of today, STOP taking any Aspirin (unless otherwise instructed by your surgeon) Aleve, Naproxen, Ibuprofen, Motrin, Advil, Goody's, BC's, all herbal medications, fish oil, and all vitamins.            Do not wear jewelry or makeup Do not wear lotions, powders, perfumes or deodorant. Do not shave 48 hours prior to surgery.   Do not bring valuables to the hospital. DO Not wear nail polish, gel polish, artificial nails, or any other type of covering on natural nails (fingers and toes) If you have artificial nails or gel coating that need to be removed by a nail salon, please have this removed prior to surgery. Artificial nails or gel coating may interfere with anesthesia's ability to adequately monitor your vital signs.             [place] is not responsible for any belongings or valuables.  Do NOT Smoke (Tobacco/Vaping)  24 hours prior to your procedure  If you use a CPAP at night, you may bring your mask for your overnight stay.   Contacts, glasses, hearing aids, dentures or partials may not be worn into surgery, please bring cases for these belongings   For patients admitted to the hospital, discharge time will be determined by your treatment  team.   Patients discharged the day of surgery will not be allowed to drive home, and someone needs to stay with them for 24 hours.  NO VISITORS WILL BE ALLOWED IN PRE-OP WHERE PATIENTS ARE PREPPED FOR SURGERY.  ONLY 1 SUPPORT PERSON MAY BE PRESENT IN THE WAITING ROOM WHILE YOU ARE IN SURGERY.  IF YOU ARE TO BE ADMITTED, ONCE YOU ARE IN YOUR ROOM YOU WILL BE ALLOWED TWO (2) VISITORS. 1 (ONE) VISITOR MAY STAY OVERNIGHT BUT MUST ARRIVE TO THE ROOM BY 8pm.  Minor children may have two parents present. Special consideration for safety and communication needs will be reviewed on a case by case basis.  Special instructions:    Oral Hygiene is also important to reduce your risk of infection.  Remember - BRUSH YOUR TEETH THE MORNING OF SURGERY WITH YOUR REGULAR TOOTHPASTE   [place]- Preparing For Surgery  Before surgery, you can play an important role. Because skin is not sterile, your skin needs to be as free of germs as possible. You can reduce the number of germs on your skin by washing with CHG (chlorahexidine gluconate) Soap before surgery.  CHG is an antiseptic cleaner which kills germs and bonds with the skin to continue killing germs even after washing.     Please do not use if you have an allergy to CHG or antibacterial soaps. If your skin becomes reddened/irritated stop  using the CHG.  Do not shave (including legs and underarms) for at least 48 hours prior to first CHG shower. It is OK to shave your face.  Please follow these instructions carefully.     Shower the NIGHT BEFORE SURGERY and the MORNING OF SURGERY with CHG Soap.   If you chose to wash your hair, wash your hair first as usual with your normal shampoo. After you shampoo, rinse your hair and body thoroughly to remove the shampoo.  Then [employer] and genitals (private parts) with your normal soap and rinse thoroughly to remove soap.  After that Use CHG Soap as you would any other liquid soap. You can apply CHG directly to the  skin and wash gently with a scrungie or a clean washcloth.   Apply the CHG Soap to your body ONLY FROM THE NECK DOWN.  Do not use on open wounds or open sores. Avoid contact with your eyes, ears, mouth and genitals (private parts). Wash Face and genitals (private parts)  with your normal soap.   Wash thoroughly, paying special attention to the area where your surgery will be performed.  Thoroughly rinse your body with warm water from the neck down.  DO NOT shower/wash with your normal soap after using and rinsing off the CHG Soap.  Pat yourself dry with a CLEAN TOWEL.  Wear CLEAN PAJAMAS to bed the night before surgery  Place CLEAN SHEETS on your bed the night before your surgery  DO NOT SLEEP WITH PETS.   Day of Surgery:  Take a shower with CHG soap. Wear Clean/Comfortable clothing the morning of surgery Do not apply any deodorants/lotions.   Remember to brush your teeth WITH YOUR REGULAR TOOTHPASTE.   Please read over the following fact sheets that you were given.

## 2021-09-06 ENCOUNTER — Encounter (HOSPITAL_COMMUNITY): Payer: Self-pay

## 2021-09-06 ENCOUNTER — Other Ambulatory Visit: Payer: Self-pay

## 2021-09-06 ENCOUNTER — Encounter (HOSPITAL_COMMUNITY)
Admission: RE | Admit: 2021-09-06 | Discharge: 2021-09-06 | Disposition: A | Discharge status: Home / Self Care | Payer: BC Managed Care – PPO | Source: Ambulatory Visit | Attending: General Surgery | Admitting: General Surgery

## 2021-09-06 VITALS — BP 142/89 | HR 73 | Temp 98.2°F | Resp 17 | Ht 70.0 in | Wt 236.5 lb

## 2021-09-06 DIAGNOSIS — Z01818 Encounter for other preprocedural examination: Secondary | ICD-10-CM

## 2021-09-06 DIAGNOSIS — Z01812 Encounter for preprocedural laboratory examination: Secondary | ICD-10-CM | POA: Insufficient documentation

## 2021-09-06 DIAGNOSIS — D649 Anemia, unspecified: Secondary | ICD-10-CM | POA: Diagnosis not present

## 2021-09-06 DIAGNOSIS — K8020 Calculus of gallbladder without cholecystitis without obstruction: Secondary | ICD-10-CM | POA: Diagnosis present

## 2021-09-06 DIAGNOSIS — K8010 Calculus of gallbladder with chronic cholecystitis without obstruction: Secondary | ICD-10-CM | POA: Diagnosis not present

## 2021-09-06 LAB — CBC
HCT: 35.6 % — ABNORMAL LOW (ref 36.0–46.0)
Hemoglobin: 11.3 g/dL — ABNORMAL LOW (ref 12.0–15.0)
MCH: 30.5 pg (ref 26.0–34.0)
MCHC: 31.7 g/dL (ref 30.0–36.0)
MCV: 96 fL (ref 80.0–100.0)
Platelets: 261 10*3/uL (ref 150–400)
RBC: 3.71 MIL/uL — ABNORMAL LOW (ref 3.87–5.11)
RDW: 12.9 % (ref 11.5–15.5)
WBC: 7 10*3/uL (ref 4.0–10.5)
nRBC: 0 % (ref 0.0–0.2)

## 2021-09-06 NOTE — Progress Notes (Signed)
PCP - Marda Stalker, PA-C Cardiologist - denies  PPM/ICD - denies   Chest x-ray - denies EKG - denies Stress Test - denies ECHO - denies Cardiac Cath - denies  Sleep Study - denies   DM- denies  Blood Thinner Instructions: n/a Aspirin Instructions: n/a  ERAS Protcol - yes, no drink   COVID TEST- n/a, ambulatory surgery   Anesthesia review: no  Patient denies shortness of breath, fever, cough and chest pain at PAT appointment   All instructions explained to the patient, with a verbal understanding of the material. Patient agrees to go over the instructions while at home for a better understanding. Patient also instructed to wear a mask for 3 days prior to surgery. The opportunity to ask questions was provided.

## 2021-09-09 ENCOUNTER — Ambulatory Visit (HOSPITAL_COMMUNITY): Payer: BC Managed Care – PPO | Admitting: Certified Registered"

## 2021-09-09 ENCOUNTER — Encounter (HOSPITAL_COMMUNITY)
Admission: RE | Disposition: A | Discharge status: Home / Self Care | Payer: Self-pay | Source: Home / Self Care | Attending: General Surgery

## 2021-09-09 ENCOUNTER — Other Ambulatory Visit: Payer: Self-pay

## 2021-09-09 ENCOUNTER — Ambulatory Visit (HOSPITAL_COMMUNITY)
Admission: RE | Admit: 2021-09-09 | Discharge: 2021-09-09 | Disposition: A | Discharge status: Home / Self Care | Payer: BC Managed Care – PPO | Attending: General Surgery | Admitting: General Surgery

## 2021-09-09 ENCOUNTER — Encounter (HOSPITAL_COMMUNITY): Payer: Self-pay | Admitting: General Surgery

## 2021-09-09 DIAGNOSIS — Z9049 Acquired absence of other specified parts of digestive tract: Secondary | ICD-10-CM

## 2021-09-09 DIAGNOSIS — K8010 Calculus of gallbladder with chronic cholecystitis without obstruction: Secondary | ICD-10-CM | POA: Insufficient documentation

## 2021-09-09 DIAGNOSIS — D649 Anemia, unspecified: Secondary | ICD-10-CM | POA: Insufficient documentation

## 2021-09-09 HISTORY — PX: CHOLECYSTECTOMY: SHX55

## 2021-09-09 LAB — POCT PREGNANCY, URINE: Preg Test, Ur: NEGATIVE

## 2021-09-09 SURGERY — LAPAROSCOPIC CHOLECYSTECTOMY
Anesthesia: General | Site: Abdomen

## 2021-09-09 MED ORDER — ACETAMINOPHEN 10 MG/ML IV SOLN: 1000.0000 mg | Freq: Once | INTRAVENOUS | Status: DC | PRN

## 2021-09-09 MED ORDER — PROPOFOL 10 MG/ML IV BOLUS
INTRAVENOUS | Status: AC
  Filled 2021-09-09: qty 20

## 2021-09-09 MED ORDER — CHLORHEXIDINE GLUCONATE 0.12 % MT SOLN
OROMUCOSAL | Status: AC
  Administered 2021-09-09: 15 mL via OROMUCOSAL
  Filled 2021-09-09: qty 15

## 2021-09-09 MED ORDER — LIDOCAINE 2% (20 MG/ML) 5 ML SYRINGE
INTRAMUSCULAR | Status: AC
  Filled 2021-09-09: qty 5

## 2021-09-09 MED ORDER — LACTATED RINGERS IV SOLN: INTRAVENOUS | Status: DC

## 2021-09-09 MED ORDER — CEFAZOLIN SODIUM-DEXTROSE 2-3 GM-%(50ML) IV SOLR
INTRAVENOUS | Status: DC | PRN
  Administered 2021-09-09: 2 g via INTRAVENOUS

## 2021-09-09 MED ORDER — SCOPOLAMINE 1 MG/3DAYS TD PT72
1.0000 | MEDICATED_PATCH | TRANSDERMAL | Status: DC
  Administered 2021-09-09: 1.5 mg via TRANSDERMAL

## 2021-09-09 MED ORDER — ONDANSETRON HCL 4 MG/2ML IJ SOLN
INTRAMUSCULAR | Status: AC
  Filled 2021-09-09: qty 2

## 2021-09-09 MED ORDER — CELECOXIB 200 MG PO CAPS
ORAL_CAPSULE | ORAL | Status: AC
  Filled 2021-09-09: qty 1

## 2021-09-09 MED ORDER — FENTANYL CITRATE (PF) 100 MCG/2ML IJ SOLN
25.0000 ug | INTRAMUSCULAR | Status: DC | PRN
  Administered 2021-09-09 (×2): 50 ug via INTRAVENOUS

## 2021-09-09 MED ORDER — 0.9 % SODIUM CHLORIDE (POUR BTL) OPTIME
TOPICAL | Status: DC | PRN
  Administered 2021-09-09: 1000 mL

## 2021-09-09 MED ORDER — FENTANYL CITRATE (PF) 250 MCG/5ML IJ SOLN
INTRAMUSCULAR | Status: AC
  Filled 2021-09-09: qty 5

## 2021-09-09 MED ORDER — OXYCODONE HCL 5 MG/5ML PO SOLN: 5.0000 mg | Freq: Once | ORAL | Status: AC | PRN

## 2021-09-09 MED ORDER — LIDOCAINE 2% (20 MG/ML) 5 ML SYRINGE
INTRAMUSCULAR | Status: DC | PRN
  Administered 2021-09-09: 60 mg via INTRAVENOUS

## 2021-09-09 MED ORDER — SODIUM CHLORIDE 0.9 % IR SOLN
Status: DC | PRN
  Administered 2021-09-09: 1000 mL

## 2021-09-09 MED ORDER — TRAMADOL HCL 50 MG PO TABS
50.0000 mg | ORAL_TABLET | Freq: Four times a day (QID) | ORAL | 0 refills | Status: AC | PRN
Start: 2021-09-09 — End: 2022-09-09

## 2021-09-09 MED ORDER — SUGAMMADEX SODIUM 200 MG/2ML IV SOLN
INTRAVENOUS | Status: DC | PRN
  Administered 2021-09-09: 200 mg via INTRAVENOUS

## 2021-09-09 MED ORDER — CHLORHEXIDINE GLUCONATE 0.12 % MT SOLN: 15.0000 mL | Freq: Once | OROMUCOSAL | Status: AC

## 2021-09-09 MED ORDER — ORAL CARE MOUTH RINSE: 15.0000 mL | Freq: Once | OROMUCOSAL | Status: AC

## 2021-09-09 MED ORDER — CEFAZOLIN SODIUM-DEXTROSE 2-4 GM/100ML-% IV SOLN
INTRAVENOUS | Status: AC
  Filled 2021-09-09: qty 100

## 2021-09-09 MED ORDER — ACETAMINOPHEN 160 MG/5ML PO SOLN: 1000.0000 mg | Freq: Once | ORAL | Status: DC | PRN

## 2021-09-09 MED ORDER — ACETAMINOPHEN 500 MG PO TABS: 1000.0000 mg | ORAL_TABLET | Freq: Once | ORAL | Status: DC | PRN

## 2021-09-09 MED ORDER — ROCURONIUM BROMIDE 10 MG/ML (PF) SYRINGE
PREFILLED_SYRINGE | INTRAVENOUS | Status: DC | PRN
Start: 2021-09-09 — End: 2021-09-09
  Administered 2021-09-09: 60 mg via INTRAVENOUS

## 2021-09-09 MED ORDER — ONDANSETRON HCL 4 MG/2ML IJ SOLN
INTRAMUSCULAR | Status: DC | PRN
Start: 2021-09-09 — End: 2021-09-09
  Administered 2021-09-09: 4 mg via INTRAVENOUS

## 2021-09-09 MED ORDER — SCOPOLAMINE 1 MG/3DAYS TD PT72
MEDICATED_PATCH | TRANSDERMAL | Status: AC
  Filled 2021-09-09: qty 1

## 2021-09-09 MED ORDER — CELECOXIB 200 MG PO CAPS
200.0000 mg | ORAL_CAPSULE | Freq: Once | ORAL | Status: AC
  Administered 2021-09-09: 200 mg via ORAL

## 2021-09-09 MED ORDER — FENTANYL CITRATE (PF) 100 MCG/2ML IJ SOLN
INTRAMUSCULAR | Status: AC
  Filled 2021-09-09: qty 2

## 2021-09-09 MED ORDER — FENTANYL CITRATE (PF) 250 MCG/5ML IJ SOLN
INTRAMUSCULAR | Status: DC | PRN
  Administered 2021-09-09 (×2): 50 ug via INTRAVENOUS
  Administered 2021-09-09: 100 ug via INTRAVENOUS
  Administered 2021-09-09: 50 ug via INTRAVENOUS

## 2021-09-09 MED ORDER — MIDAZOLAM HCL 5 MG/5ML IJ SOLN
INTRAMUSCULAR | Status: DC | PRN
Start: 2021-09-09 — End: 2021-09-09
  Administered 2021-09-09: 2 mg via INTRAVENOUS

## 2021-09-09 MED ORDER — DEXAMETHASONE SODIUM PHOSPHATE 10 MG/ML IJ SOLN
INTRAMUSCULAR | Status: AC
  Filled 2021-09-09: qty 1

## 2021-09-09 MED ORDER — PROPOFOL 10 MG/ML IV BOLUS
INTRAVENOUS | Status: DC | PRN
  Administered 2021-09-09: 190 mg via INTRAVENOUS

## 2021-09-09 MED ORDER — MIDAZOLAM HCL 2 MG/2ML IJ SOLN
INTRAMUSCULAR | Status: AC
  Filled 2021-09-09: qty 2

## 2021-09-09 MED ORDER — OXYCODONE HCL 5 MG PO TABS
ORAL_TABLET | ORAL | Status: AC
  Filled 2021-09-09: qty 1

## 2021-09-09 MED ORDER — BUPIVACAINE HCL 0.25 % IJ SOLN
INTRAMUSCULAR | Status: DC | PRN
  Administered 2021-09-09: 10 mL

## 2021-09-09 MED ORDER — OXYCODONE HCL 5 MG PO TABS
5.0000 mg | ORAL_TABLET | Freq: Once | ORAL | Status: AC | PRN
  Administered 2021-09-09: 5 mg via ORAL

## 2021-09-09 MED ORDER — ACETAMINOPHEN 500 MG PO TABS
1000.0000 mg | ORAL_TABLET | Freq: Once | ORAL | Status: AC
Start: 2021-09-09 — End: 2021-09-09
  Administered 2021-09-09: 1000 mg via ORAL

## 2021-09-09 MED ORDER — DEXAMETHASONE SODIUM PHOSPHATE 10 MG/ML IJ SOLN
INTRAMUSCULAR | Status: DC | PRN
Start: 2021-09-09 — End: 2021-09-09
  Administered 2021-09-09: 5 mg via INTRAVENOUS

## 2021-09-09 MED ORDER — ACETAMINOPHEN 500 MG PO TABS
ORAL_TABLET | ORAL | Status: AC
  Filled 2021-09-09: qty 2

## 2021-09-09 SURGICAL SUPPLY — 40 items
BAG COUNTER SPONGE SURGICOUNT (BAG) ×2 IMPLANT
CANISTER SUCT 3000ML PPV (MISCELLANEOUS) ×2 IMPLANT
CHLORAPREP W/TINT 26 (MISCELLANEOUS) ×2 IMPLANT
CLIP LIGATING HEMO O LOK GREEN (MISCELLANEOUS) ×3 IMPLANT
COVER SURGICAL LIGHT HANDLE (MISCELLANEOUS) ×2 IMPLANT
COVER TRANSDUCER ULTRASND (DRAPES) ×2 IMPLANT
DERMABOND ADVANCED (GAUZE/BANDAGES/DRESSINGS) ×1
DERMABOND ADVANCED .7 DNX12 (GAUZE/BANDAGES/DRESSINGS) ×1 IMPLANT
ELECT REM PT RETURN 9FT ADLT (ELECTROSURGICAL) ×2
ELECTRODE REM PT RTRN 9FT ADLT (ELECTROSURGICAL) ×1 IMPLANT
GLOVE SURG SYN 7.5  E (GLOVE) ×1
GLOVE SURG SYN 7.5 E (GLOVE) ×1 IMPLANT
GLOVE SURG SYN 7.5 PF PI (GLOVE) ×1 IMPLANT
GOWN STRL REUS W/ TWL LRG LVL3 (GOWN DISPOSABLE) ×2 IMPLANT
GOWN STRL REUS W/ TWL XL LVL3 (GOWN DISPOSABLE) ×1 IMPLANT
GOWN STRL REUS W/TWL LRG LVL3 (GOWN DISPOSABLE) ×2
GOWN STRL REUS W/TWL XL LVL3 (GOWN DISPOSABLE) ×1
GRASPER SUT TROCAR 14GX15 (MISCELLANEOUS) ×2 IMPLANT
KIT BASIN OR (CUSTOM PROCEDURE TRAY) ×2 IMPLANT
KIT TURNOVER KIT B (KITS) ×2 IMPLANT
NDL INSUFFLATION 14GA 120MM (NEEDLE) ×1 IMPLANT
NEEDLE INSUFFLATION 14GA 120MM (NEEDLE) ×2 IMPLANT
NS IRRIG 1000ML POUR BTL (IV SOLUTION) ×2 IMPLANT
PAD ARMBOARD 7.5X6 YLW CONV (MISCELLANEOUS) ×2 IMPLANT
POUCH LAPAROSCOPIC INSTRUMENT (MISCELLANEOUS) ×2 IMPLANT
POUCH RETRIEVAL ECOSAC 10 (ENDOMECHANICALS) IMPLANT
POUCH RETRIEVAL ECOSAC 10MM (ENDOMECHANICALS) ×1
SCISSORS LAP 5X35 DISP (ENDOMECHANICALS) ×2 IMPLANT
SET IRRIG TUBING LAPAROSCOPIC (IRRIGATION / IRRIGATOR) ×2 IMPLANT
SET TUBE SMOKE EVAC HIGH FLOW (TUBING) ×2 IMPLANT
SLEEVE ENDOPATH XCEL 5M (ENDOMECHANICALS) ×3 IMPLANT
SPECIMEN JAR SMALL (MISCELLANEOUS) ×2 IMPLANT
SUT MNCRL AB 4-0 PS2 18 (SUTURE) ×2 IMPLANT
TOWEL GREEN STERILE (TOWEL DISPOSABLE) ×2 IMPLANT
TOWEL GREEN STERILE FF (TOWEL DISPOSABLE) ×2 IMPLANT
TRAY LAPAROSCOPIC MC (CUSTOM PROCEDURE TRAY) ×2 IMPLANT
TROCAR XCEL NON-BLD 11X100MML (ENDOMECHANICALS) ×2 IMPLANT
TROCAR XCEL NON-BLD 5MMX100MML (ENDOMECHANICALS) ×2 IMPLANT
WARMER LAPAROSCOPE (MISCELLANEOUS) ×2 IMPLANT
WATER STERILE IRR 1000ML POUR (IV SOLUTION) ×2 IMPLANT

## 2021-09-09 NOTE — Op Note (Signed)
09/09/2021  1:25 PM  PATIENT:  Andrea Rojas  31 y.o. female  PRE-OPERATIVE DIAGNOSIS:  GALLSTONES  POST-OPERATIVE DIAGNOSIS:  GALLSTONES  PROCEDURE:  Procedure(s): LAPAROSCOPIC CHOLECYSTECTOMY (N/A)  SURGEON:  Surgeon(s) and Role:    * Ralene Ok, MD - Primary  ASSISTANTS: ASSISTANTS: Yvone Neu MD, PGY-6    ANESTHESIA:   local and general  EBL:  minimal   BLOOD ADMINISTERED:none  DRAINS: none   LOCAL MEDICATIONS USED:  BUPIVICAINE   SPECIMEN:  Source of Specimen:  gallbladder  DISPOSITION OF SPECIMEN:  PATHOLOGY  COUNTS:  YES  TOURNIQUET:  * No tourniquets in log *  DICTATION: .Dragon Dictation  The patient was taken to the operating and placed in the supine position with bilateral SCDs in place.  The patient was prepped and draped in the usual sterile fashion. A time out was called and all facts were verified. A pneumoperitoneum was obtained via A Veress needle technique to a pressure of 85mm of mercury.  A 45mm trochar was then placed in the right upper quadrant under visualization, and there were no injuries to any abdominal organs. A 11 mm port was then placed in the umbilical region after infiltrating with local anesthesia under direct visualization. A second and third epigastric port and right lower quadrant port placement under direct visualization, respectively.    The gallbladder was identified and retracted, the peritoneum was then sharply dissected from the gallbladder and this dissection was carried down to Calot's triangle. The cystic duct was identified and stripped away circumferentially and seen going into the gallbladder 360, the critical angle was obtained.  2 clips were placed proximally one distally and the cystic duct transected. The cystic artery was identified and 2 clips placed proximally and one distally and transected.  We then proceeded to remove the gallbladder off the hepatic fossa with Bovie cautery. A retrieval bag was then placed in  the abdomen and gallbladder placed in the bag. The hepatic fossa was then reexamined and hemostasis was achieved with Bovie cautery and was excellent at the end of the case.   The subhepatic fossa and perihepatic fossa was then irrigated until the effluent was clear.  The gallbladder and bag were removed from the abdominal cavity. The 11 mm trocar fascia was reapproximated with the Endo Close #1 Vicryl x2.  The pneumoperitoneum was evacuated and all trochars removed under direct visulalization.  The skin was then closed with 4-0 Monocryl and the skin dressed with Dermabond.    The patient was awaken from general anesthesia and taken to the recovery room in stable condition.   PLAN OF CARE: Discharge to home after PACU  PATIENT DISPOSITION:  PACU - hemodynamically stable.   Delay start of Pharmacological VTE agent (>24hrs) due to surgical blood loss or risk of bleeding: not applicable

## 2021-09-09 NOTE — Transfer of Care (Signed)
Immediate Anesthesia Transfer of Care Note  Patient: Andrea Rojas  Procedure(s) Performed: LAPAROSCOPIC CHOLECYSTECTOMY (Abdomen)  Patient Location: PACU  Anesthesia Type:General  Level of Consciousness: awake, alert , oriented and patient cooperative  Airway & Oxygen Therapy: Patient Spontanous Breathing and Patient connected to nasal cannula oxygen  Post-op Assessment: Report given to RN and Post -op Vital signs reviewed and stable  Post vital signs: Reviewed and stable  Last Vitals:  Vitals Value Taken Time  BP 137/91 09/09/21 1349  Temp    Pulse 78 09/09/21 1349  Resp 15 09/09/21 1349  SpO2 99 % 09/09/21 1349  Vitals shown include unvalidated device data.  Last Pain:  Vitals:   09/09/21 1043  TempSrc:   PainSc: 0-No pain         Complications: No notable events documented.

## 2021-09-09 NOTE — Discharge Instructions (Signed)
[facility] ______[facility] LAPAROSCOPIC SURGERY: POST OP INSTRUCTIONS Always review your discharge instruction sheet given to you by the facility where your surgery was performed. IF YOU HAVE DISABILITY OR FAMILY LEAVE FORMS, YOU MUST BRING THEM TO THE OFFICE FOR PROCESSING.   DO NOT GIVE THEM TO YOUR DOCTOR.  A prescription for pain medication may be given to you upon discharge.  Take your pain medication as prescribed, if needed.  If narcotic pain medicine is not needed, then you may take acetaminophen (Tylenol) or ibuprofen (Advil) as needed. Take your usually prescribed medications unless otherwise directed. If you need a refill on your pain medication, please contact your pharmacy.  They will contact our office to request authorization. Prescriptions will not be filled after 5pm or on week-ends. You should follow a light diet the first few days after arrival home, such as soup and crackers, etc.  Be sure to include lots of fluids daily. Most patients will experience some swelling and bruising in the area of the incisions.  Ice packs will help.  Swelling and bruising can take several days to resolve.  It is common to experience some constipation if taking pain medication after surgery.  Increasing fluid intake and taking a stool softener (such as Colace) will usually help or prevent this problem from occurring.  A mild laxative (Milk of Magnesia or Miralax) should be taken according to package instructions if there are no bowel movements after 48 hours. Unless discharge instructions indicate otherwise, you may remove your bandages 24-48 hours after surgery, and you may shower at that time.  You may have steri-strips (small skin tapes) in place directly over the incision.  These strips should be left on the skin for 7-10 days.  If your surgeon used skin glue on the incision, you may shower in 24 hours.  The glue will flake off over the next 2-3 weeks.  Any sutures or staples will be  removed at the office during your follow-up visit. ACTIVITIES:  You may resume regular (light) daily activities beginning the next day--such as daily self-care, walking, climbing stairs--gradually increasing activities as tolerated.  You may have sexual intercourse when it is comfortable.  Refrain from any heavy lifting or straining until approved by your doctor. You may drive when you are no longer taking prescription pain medication, you can comfortably wear a seatbelt, and you can safely maneuver your car and apply brakes. RETURN TO WORK:  __________________________________________________________ You should see your doctor in the office for a follow-up appointment approximately 2-3 weeks after your surgery.  Make sure that you call for this appointment within a day or two after you arrive home to insure a convenient appointment time. OTHER INSTRUCTIONS: __________________________________________________________________________________________________________________________ __________________________________________________________________________________________________________________________ WHEN TO CALL YOUR DOCTOR: Fever over 101.0 Inability to urinate Continued bleeding from incision. Increased pain, redness, or drainage from the incision. Increasing abdominal pain  The clinic staff is available to answer your questions during regular business hours.  Please don't hesitate to call and ask to speak to one of the nurses for clinical concerns.  If you have a medical emergency, go to the nearest emergency room or call 911.  A surgeon from [facility] is always on call at the hospital. [address], [address], [place], [place]  [address] ? [address] [address], [place], [place]   [address] [phone] ? [phone] ? FAX [phone] Web site: [email or web address]  

## 2021-09-09 NOTE — Anesthesia Postprocedure Evaluation (Signed)
Anesthesia Post Note  Patient: Denajah Daughety  Procedure(s) Performed: LAPAROSCOPIC CHOLECYSTECTOMY (Abdomen)     Patient location during evaluation: PACU Anesthesia Type: General Level of consciousness: sedated Pain management: pain level controlled Vital Signs Assessment: post-procedure vital signs reviewed and stable Respiratory status: spontaneous breathing and respiratory function stable Cardiovascular status: stable Postop Assessment: no apparent nausea or vomiting Anesthetic complications: no   No notable events documented.  Last Vitals:  Vitals:   09/09/21 1435 09/09/21 1450  BP: (!) 157/97 (!) 146/93  Pulse: 69   Resp: 10 12  Temp: 36.6 C   SpO2: 100% 100%    Last Pain:  Vitals:   09/09/21 1501  TempSrc:   PainSc: 4                  Linda Biehn DANIEL

## 2021-09-09 NOTE — Anesthesia Procedure Notes (Signed)
Procedure Name: Intubation Date/Time: 09/09/2021 12:28 PM Performed by: Colin Benton, CRNA Pre-anesthesia Checklist: Patient identified, Emergency Drugs available, Suction available and Patient being monitored Patient Re-evaluated:Patient Re-evaluated prior to induction Oxygen Delivery Method: Circle system utilized Preoxygenation: Pre-oxygenation with 100% oxygen Induction Type: IV induction Ventilation: Mask ventilation without difficulty Laryngoscope Size: Mac and 3 Grade View: Grade I Tube type: Oral Tube size: 7.0 mm Number of attempts: 1 Airway Equipment and Method: Stylet Placement Confirmation: ETT inserted through vocal cords under direct vision, positive ETCO2 and breath sounds checked- equal and bilateral Secured at: 22 cm Tube secured with: Tape Dental Injury: Teeth and Oropharynx as per pre-operative assessment  Comments: Intubation by Valentino Nose RN with supervision by CRNA and MD

## 2021-09-09 NOTE — Anesthesia Preprocedure Evaluation (Addendum)
Anesthesia Evaluation  Patient identified by MRN, date of birth, ID band Patient awake    Reviewed: Allergy & Precautions, NPO status , Patient's Chart, lab work & pertinent test results  Airway Mallampati: II  TM Distance: >3 FB Neck ROM: Full    Dental no notable dental hx. (+) Dental Advisory Given   Pulmonary neg pulmonary ROS,    Pulmonary exam normal        Cardiovascular negative cardio ROS Normal cardiovascular exam     Neuro/Psych negative neurological ROS  negative psych ROS   GI/Hepatic Neg liver ROS,   Endo/Other  negative endocrine ROS  Renal/GU negative Renal ROS  negative genitourinary   Musculoskeletal negative musculoskeletal ROS (+)   Abdominal   Peds negative pediatric ROS (+)  Hematology negative hematology ROS (+) anemia ,   Anesthesia Other Findings   Reproductive/Obstetrics negative OB ROS                            Anesthesia Physical Anesthesia Plan  ASA: 2  Anesthesia Plan: General   Post-op Pain Management: Celebrex PO (pre-op) and Tylenol PO (pre-op)   Induction:   PONV Risk Score and Plan: 4 or greater and Ondansetron, Midazolam, Scopolamine patch - Pre-op and Dexamethasone  Airway Management Planned: Oral ETT  Additional Equipment:   Intra-op Plan:   Post-operative Plan: Extubation in OR  Informed Consent: I have reviewed the patients History and Physical, chart, labs and discussed the procedure including the risks, benefits and alternatives for the proposed anesthesia with the patient or authorized representative who has indicated his/her understanding and acceptance.     Dental advisory given  Plan Discussed with: Anesthesiologist and CRNA  Anesthesia Plan Comments:         Anesthesia Quick Evaluation

## 2021-09-09 NOTE — H&P (Signed)
Chief Complaint: Cholelithiasis   History of Present Illness: Patient is a 31 year old female who follows back up today after being seen in April this year. Patient had a recurrent episode of epigastric to right upper quadrant abdominal pain that radiated to the back while she was on vacation. She states this was after high fatty meal. Patient states that she went to the ER at a state and was given pain medication. She states that the pain lasted several days and got better. She states that she had some nausea and vomiting associated with it.  Patient states that since that time she is change her diet and stay away from any high fatty meals. She states this is not helping.  Patient previously had an ultrasound revealed multiple gallstones.  Patient's had no previous abdominal surgery.  Review of Systems: A complete review of systems was obtained from the patient. I have reviewed this information and discussed as appropriate with the patient. See HPI as well for other ROS.  Review of Systems  Constitutional: Negative for fever.  HENT: Negative for congestion.  Eyes: Negative for blurred vision.  Respiratory: Negative for cough, shortness of breath and wheezing.  Cardiovascular: Negative for chest pain and palpitations.  Gastrointestinal: Positive for abdominal pain, nausea and vomiting. Negative for heartburn.  Genitourinary: Negative for dysuria.  Musculoskeletal: Negative for myalgias.  Skin: Negative for rash.  Neurological: Negative for dizziness and headaches.  Psychiatric/Behavioral: Negative for depression and suicidal ideas.  All other systems reviewed and are negative.   Medical History: History reviewed. No pertinent past medical history.  There is no problem list on file for this patient.  History reviewed. No pertinent surgical history.   No Known Allergies  No current outpatient medications on file prior to visit.   No current facility-administered medications  on file prior to visit.   Family History  Problem Relation Age of Onset   High blood pressure (Hypertension) Father    Social History   Tobacco Use  Smoking Status Never Smoker  Smokeless Tobacco Never Used    Social History   Socioeconomic History   Marital status: Single  Tobacco Use   Smoking status: Never Smoker   Smokeless tobacco: Never Used  Building services engineer Use: Never used  Substance and Sexual Activity   Alcohol use: Yes   Drug use: Never   Objective:   Vitals:  03/17/21 1042  BP: (!) 142/84  Pulse: 93  Temp: 36.7 C (98 F)  SpO2: 96%  Weight: (!) 110 kg (242 lb 9.6 oz)  Height: 177.8 cm (5\' 10" )  PainSc: 0-No pain   Body mass index is 34.81 kg/m.  Physical Exam Constitutional:  Appearance: Normal appearance.  HENT:  Head: Normocephalic and atraumatic.  Mouth/Throat:  Mouth: Mucous membranes are moist.  Pharynx: Oropharynx is clear.  Eyes:  General: No scleral icterus. Pupils: Pupils are equal, round, and reactive to light.  Cardiovascular:  Rate and Rhythm: Normal rate and regular rhythm.  Pulses: Normal pulses.  Heart sounds: No murmur heard. No friction rub. No gallop.  Pulmonary:  Effort: Pulmonary effort is normal. No respiratory distress.  Breath sounds: Normal breath sounds. No stridor.  Abdominal:  General: Abdomen is flat.  Musculoskeletal:  General: No swelling.  Skin: General: Skin is warm.  Neurological:  General: No focal deficit present.  Mental Status: She is alert and oriented to person, place, and time. Mental status is at baseline.  Psychiatric:  Mood and Affect: Mood normal.  Thought  Content: Thought content normal.  Judgment: Judgment normal.     Assessment and Plan:  Diagnoses and all orders for this visit:  Calculus of gallbladder with chronic cholecystitis without obstruction   Julita Ozbun is a 31 y.o. female   31 year old female with symptomatic cholelithiasis  1. We will proceed to the OR for a  lap cholecystectomy. 2. All risks and benefits were discussed with the patient to generally include: infection, bleeding, possible need for post op ERCP, damage to the bile ducts, and bile leak. Alternatives were offered and described. All questions were answered and the patient voiced understanding of the procedure and wishes to proceed at this point with a laparoscopic cholecystectomy  No follow-ups on file.  Axel Filler, MD, [facility], [facility]

## 2021-09-10 ENCOUNTER — Encounter (HOSPITAL_COMMUNITY): Payer: Self-pay | Admitting: General Surgery

## 2021-09-13 LAB — SURGICAL PATHOLOGY

## 2021-12-07 ENCOUNTER — Encounter: Payer: Self-pay | Admitting: Emergency Medicine

## 2021-12-07 ENCOUNTER — Ambulatory Visit
Admission: EM | Admit: 2021-12-07 | Discharge: 2021-12-07 | Disposition: A | Discharge status: Home / Self Care | Payer: BC Managed Care – PPO | Attending: Emergency Medicine | Admitting: Emergency Medicine

## 2021-12-07 DIAGNOSIS — J020 Streptococcal pharyngitis: Secondary | ICD-10-CM

## 2021-12-07 LAB — POCT RAPID STREP A (OFFICE): Rapid Strep A Screen: POSITIVE — AB

## 2021-12-07 MED ORDER — AMOXICILLIN 500 MG PO CAPS
500.0000 mg | ORAL_CAPSULE | Freq: Two times a day (BID) | ORAL | 0 refills | Status: AC
Start: 2021-12-07 — End: 2021-12-17

## 2021-12-07 NOTE — ED Provider Notes (Signed)
?[facility] ? ? ? ?CSN: [id number] ?Arrival date & time: 12/07/21  0810 ? ? ?  ? ?History   ?Chief Complaint ?Chief Complaint  ?Patient presents with  ? Sore Throat  ? Nasal Congestion  ? ? ?HPI ?Andrea Rojas is a 31 y.o. female.  Patient presents with sore throat, runny nose, nasal congestion since last night.  No fever, rash, cough, shortness of breath, vomiting, diarrhea, or other symptoms.  No OTC medications taken today.  No pertinent medical history.  She denies current pregnancy or breastfeeding.   ? ? ?The history is provided by the patient.  ? ?Past Medical History:  ?Diagnosis Date  ? Hx of varicella   ? Vaginal Pap smear, abnormal   ? ? ?Patient Active Problem List  ? Diagnosis Date Noted  ? Postpartum state 08/29/2013  ? Labor and delivery, indication for care 08/28/2013  ? ? ?Past Surgical History:  ?Procedure Laterality Date  ? CHOLECYSTECTOMY N/A 09/09/2021  ? Procedure: LAPAROSCOPIC CHOLECYSTECTOMY;  Surgeon: Ralene Ok, MD;  Location: [facility];  Service: General;  Laterality: N/A;  ? NO PAST SURGERIES    ? ? ?OB History   ? ? Gravida  ?2  ? Para  ?1  ? Term  ?1  ? Preterm  ?   ? AB  ?1  ? Living  ?1  ?  ? ? SAB  ?   ? IAB  ?1  ? Ectopic  ?   ? Multiple  ?   ? Live Births  ?1  ?   ?  ?  ? ? ? ?Home Medications   ? ?Prior to Admission medications   ?Medication Sig Start Date End Date Taking? Authorizing Provider  ?amoxicillin (AMOXIL) 500 MG capsule Take 1 capsule (500 mg total) by mouth 2 (two) times daily for 10 days. 12/07/21 12/17/21 Yes Sharion Balloon, NP  ?Multiple Vitamin (MULTIVITAMIN WITH MINERALS) TABS tablet Take 1 tablet by mouth once a week.    [provider]  ?norgestimate-ethinyl estradiol (ORTHO-CYCLEN) 0.25-35 MG-MCG tablet Take 1 tablet by mouth daily at 10 pm. 06/21/21   [provider]  ?traMADol (ULTRAM) 50 MG tablet Take 1 tablet (50 mg total) by mouth every 6 (six) hours as needed. 09/09/21 09/09/22  Ralene Ok, MD  ? ? ?Family History ?Family History   ?Problem Relation Age of Onset  ? Hyperlipidemia Father   ? Cancer Cousin   ?     breast  ? Alcohol abuse Neg Hx   ? Arthritis Neg Hx   ? Asthma Neg Hx   ? Birth defects Neg Hx   ? COPD Neg Hx   ? Diabetes Neg Hx   ? Depression Neg Hx   ? Drug abuse Neg Hx   ? Early death Neg Hx   ? Hearing loss Neg Hx   ? Heart disease Neg Hx   ? Hypertension Neg Hx   ? Kidney disease Neg Hx   ? Learning disabilities Neg Hx   ? Mental illness Neg Hx   ? Mental retardation Neg Hx   ? Miscarriages / Stillbirths Neg Hx   ? Stroke Neg Hx   ? Vision loss Neg Hx   ? ? ?Social History ?Social History  ? ?Tobacco Use  ? Smoking status: Never  ? Smokeless tobacco: Never  ?Vaping Use  ? Vaping Use: Never used  ?Substance Use Topics  ? Alcohol use: Yes  ?  Comment: very rare  ? Drug use:  No  ? ? ? ?Allergies   ?Patient has no known allergies. ? ? ?Review of Systems ?Review of Systems  ?Constitutional:  Negative for chills and fever.  ?HENT:  Positive for congestion, rhinorrhea and sore throat. Negative for ear pain.   ?Respiratory:  Negative for cough and shortness of breath.   ?Cardiovascular:  Negative for chest pain and palpitations.  ?Gastrointestinal:  Negative for diarrhea and vomiting.  ?Skin:  Negative for color change and rash.  ?All other systems reviewed and are negative. ? ? ?Physical Exam ?Triage Vital Signs ?ED Triage Vitals  ?Enc Vitals Group  ?   BP   ?   Pulse   ?   Resp   ?   Temp   ?   Temp src   ?   SpO2   ?   Weight   ?   Height   ?   Head Circumference   ?   Peak Flow   ?   Pain Score   ?   Pain Loc   ?   Pain Edu?   ?   Excl. in [place]?   ? ?No data found. ? ?Updated Vital Signs ?BP (!) 143/80   Pulse (!) 110   Temp 98.4 ?F (36.9 ?C)   Resp 18   SpO2 96%  ? ?Visual Acuity ?Right Eye Distance:   ?Left Eye Distance:   ?Bilateral Distance:   ? ?Right Eye Near:   ?Left Eye Near:    ?Bilateral Near:    ? ?Physical Exam ?Vitals and nursing note reviewed.  ?Constitutional:   ?   General: She is not in acute distress. ?    Appearance: Normal appearance. She is well-developed. She is not ill-appearing.  ?HENT:  ?   Right Ear: Tympanic membrane normal.  ?   Left Ear: Tympanic membrane normal.  ?   Nose: Rhinorrhea present.  ?   Mouth/Throat:  ?   Mouth: Mucous membranes are moist.  ?   Pharynx: Posterior oropharyngeal erythema present.  ?Cardiovascular:  ?   Rate and Rhythm: Normal rate and regular rhythm.  ?   Heart sounds: Normal heart sounds.  ?Pulmonary:  ?   Effort: Pulmonary effort is normal. No respiratory distress.  ?   Breath sounds: Normal breath sounds.  ?Musculoskeletal:  ?   Cervical back: Neck supple.  ?Skin: ?   General: Skin is warm and dry.  ?Neurological:  ?   Mental Status: She is alert.  ?Psychiatric:     ?   Mood and Affect: Mood normal.     ?   Behavior: Behavior normal.  ? ? ? ?UC Treatments / Results  ?Labs ?(all labs ordered are listed, but only abnormal results are displayed) ?Labs Reviewed  ?POCT RAPID STREP A (OFFICE) - Abnormal; Notable for the following components:  ?    Result Value  ? Rapid Strep A Screen Positive (*)   ? All other components within normal limits  ? ? ?EKG ? ? ?Radiology ?No results found. ? ?Procedures ?Procedures (including critical care time) ? ?Medications Ordered in UC ?Medications - No data to display ? ?Initial Impression / Assessment and Plan / UC Course  ?I have reviewed the triage vital signs and the nursing notes. ? ?Pertinent labs & imaging results that were available during my care of the patient were reviewed by me and considered in my medical decision making (see chart for details). ? ?  ?Strep pharyngitis.  Rapid strep positive.  Treating with amoxicillin.  Discussed Tylenol or ibuprofen as needed.  Instructed patient to follow-up with her PCP if her symptoms are not improving.  She agrees to plan of care. ? ?Final Clinical Impressions(s) / UC Diagnoses  ? ?Final diagnoses:  ?Strep pharyngitis  ? ? ? ?Discharge Instructions   ? ?  ?Take the amoxicillin as directed for your  strep throat.  Take Tylenol or ibuprofen as needed.  Follow-up with your primary care provider if your symptoms are not improving. ? ? ? ? ?ED Prescriptions   ? ? Medication Sig Dispense Auth. Provider  ? amoxicillin (AMOXIL) 500 MG capsule Take 1 capsule (500 mg total) by mouth 2 (two) times daily for 10 days. 20 capsule Sharion Balloon, NP  ? ?  ? ?PDMP not reviewed this encounter. ?  ?Sharion Balloon, NP ?12/07/21 0902 ? ?

## 2021-12-07 NOTE — Discharge Instructions (Addendum)
Take the amoxicillin as directed for your strep throat.  Take Tylenol or ibuprofen as needed.  Follow-up with your primary care provider if your symptoms are not improving. ?

## 2021-12-07 NOTE — ED Triage Notes (Signed)
Pt here with sore throat and nasal congestion starting last night.  ?

## 2021-12-26 IMAGING — MG DIGITAL SCREENING BILAT W/ TOMO W/ CAD
8 series · 8 of 24 positions shown · non-contrast
Comparison: None.

CLINICAL DATA: Screening.

EXAM:
DIGITAL SCREENING BILATERAL MAMMOGRAM WITH TOMO AND CAD

[R CC synth-2D]
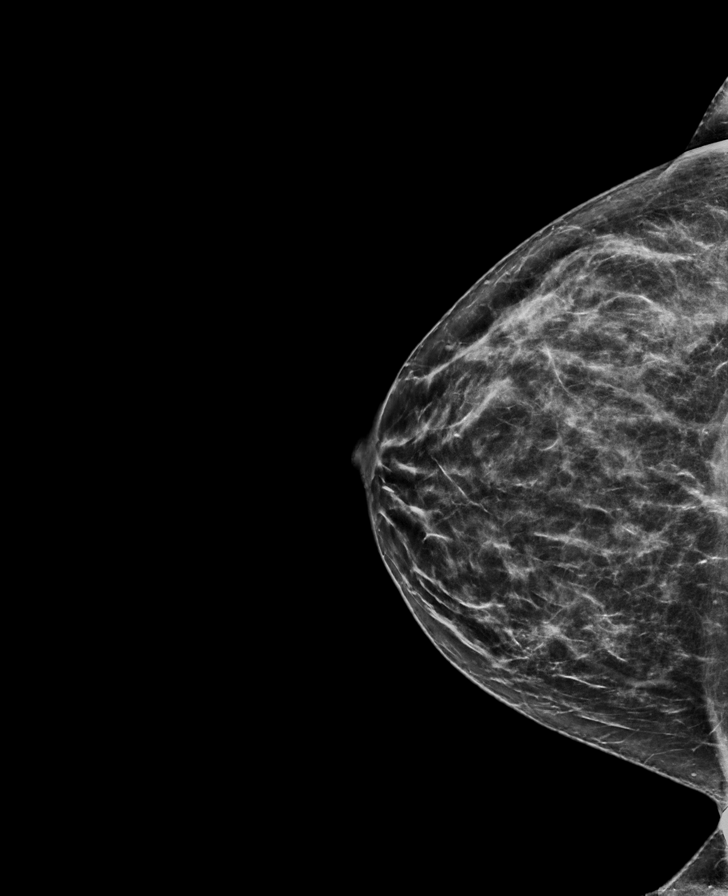

[L CC synth-2D]
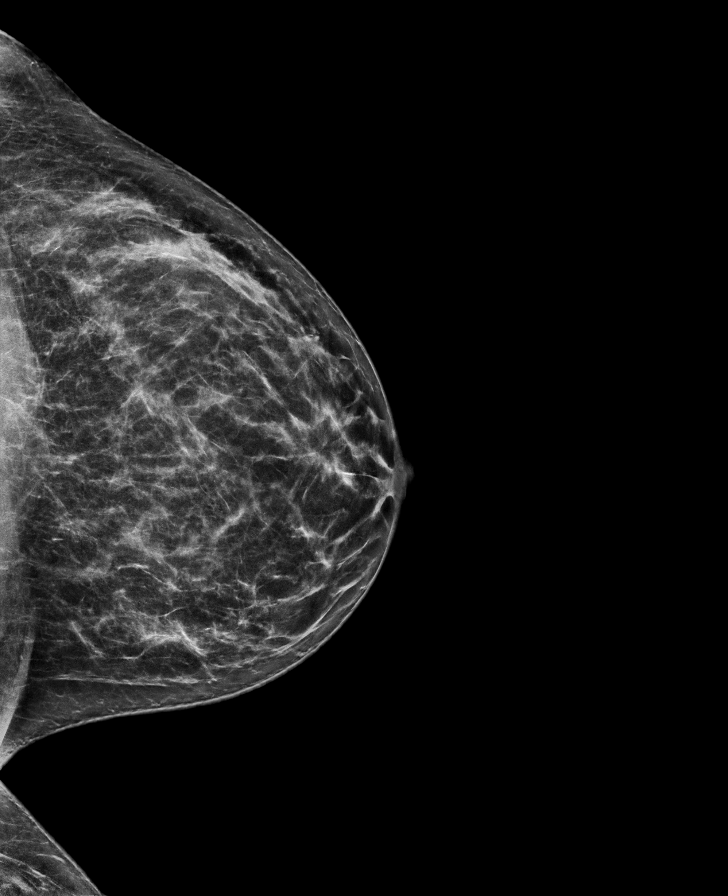

[R MLO synth-2D]
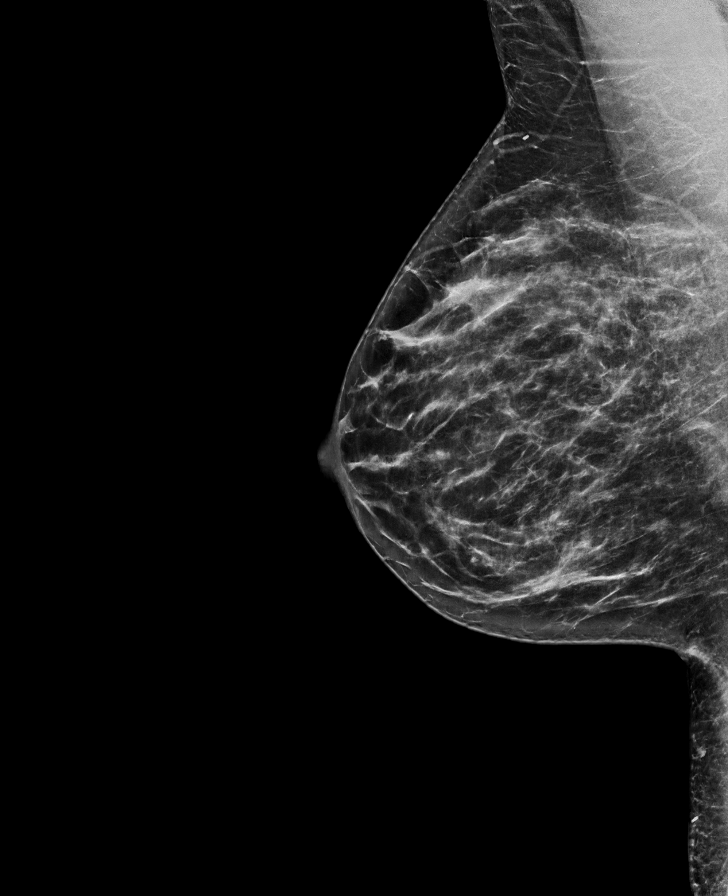

[L MLO synth-2D]
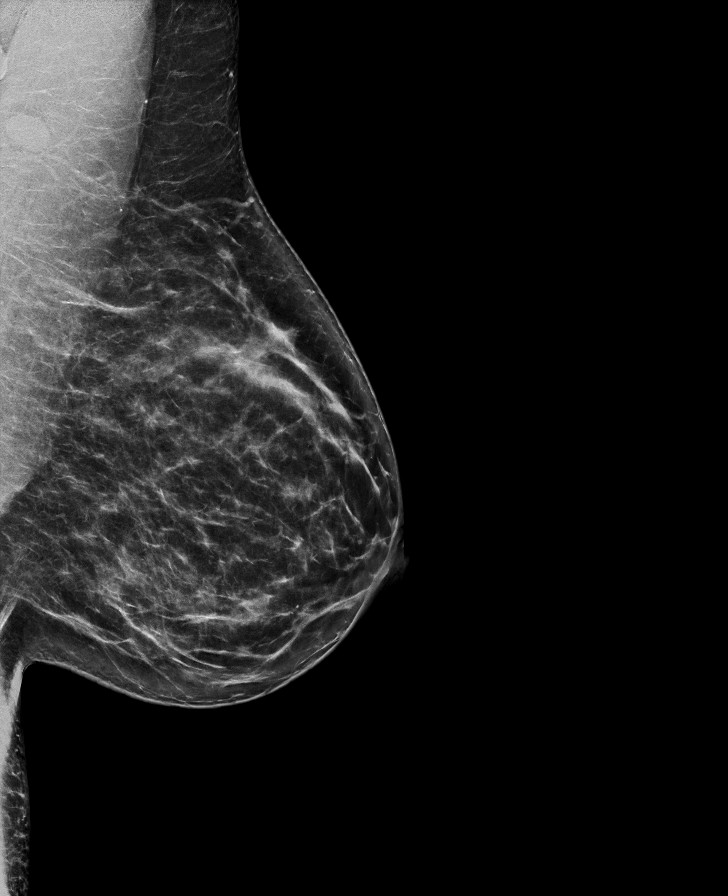

[L MLO tomo · tomo slice 41/82.0]
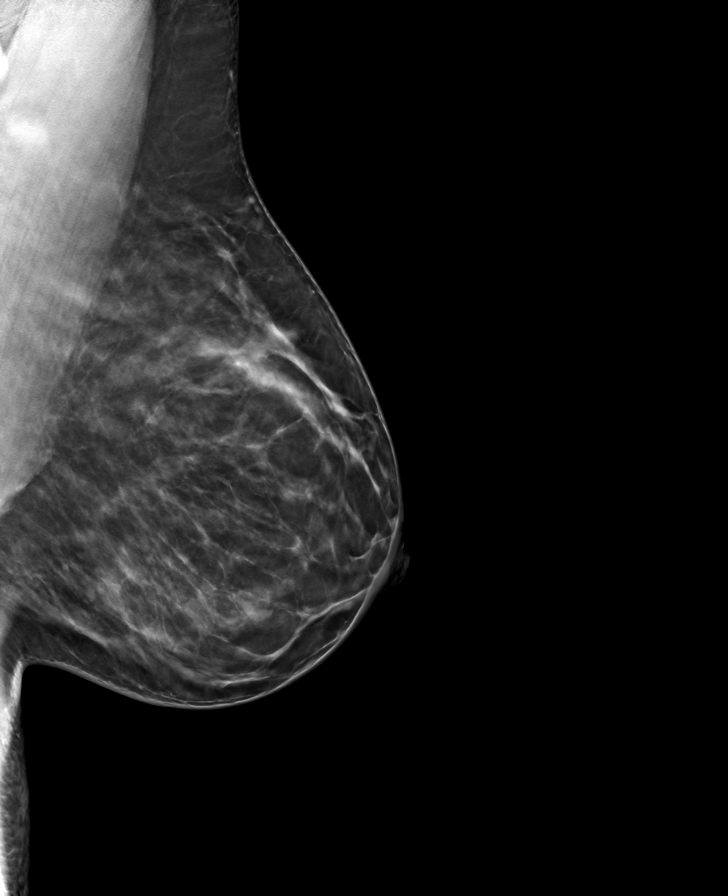

[R CC tomo · tomo slice 35/70.0]
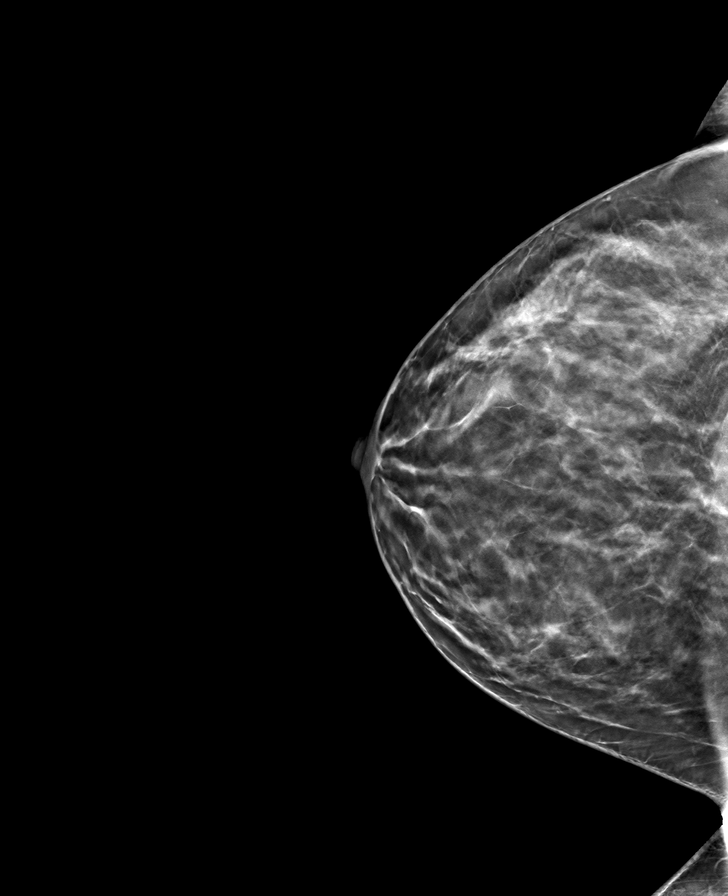

[R MLO tomo · tomo slice 38/75.0]
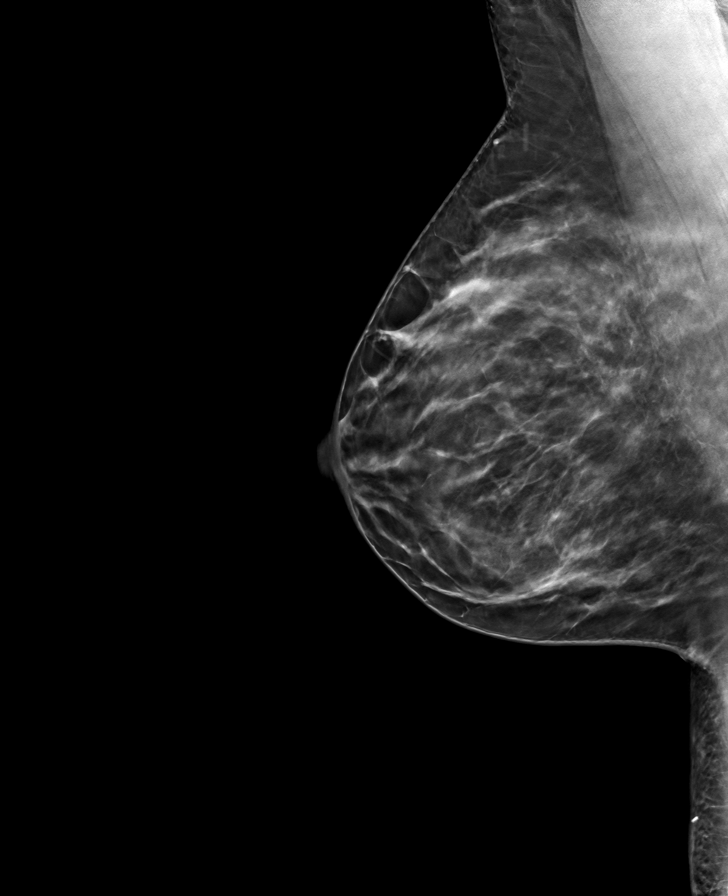

[L CC tomo · tomo slice 35/69.0]
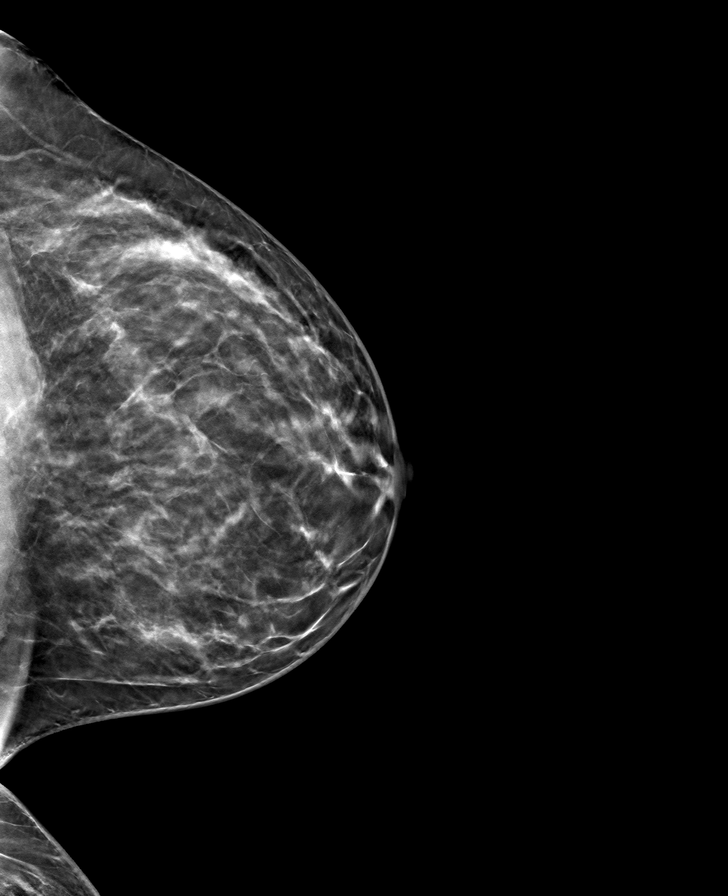

[8 of 24 positions shown; findings below may reference images not displayed]

ACR Breast Density Category c: The breast tissue is heterogeneously
dense, which may obscure small masses
FINDINGS: There are no findings suspicious for malignancy. Images were
processed with CAD.
IMPRESSION: No mammographic evidence of malignancy. A result letter of this
screening mammogram will be mailed directly to the patient.

RECOMMENDATION:
Screening mammogram at age 38. (Code:NM-7-TKP)

BI-RADS CATEGORY  1: Negative.

## 2023-03-08 IMAGING — US US ABDOMEN LIMITED
1 series · 14 of 25 positions shown · non-contrast
Comparison: 09/29/2020

CLINICAL DATA: Right upper quadrant pain.

EXAM:
ULTRASOUND ABDOMEN LIMITED RIGHT UPPER QUADRANT

[Series 1: us abdomen limited · 14 of 58 slices shown]
[im 1/58]
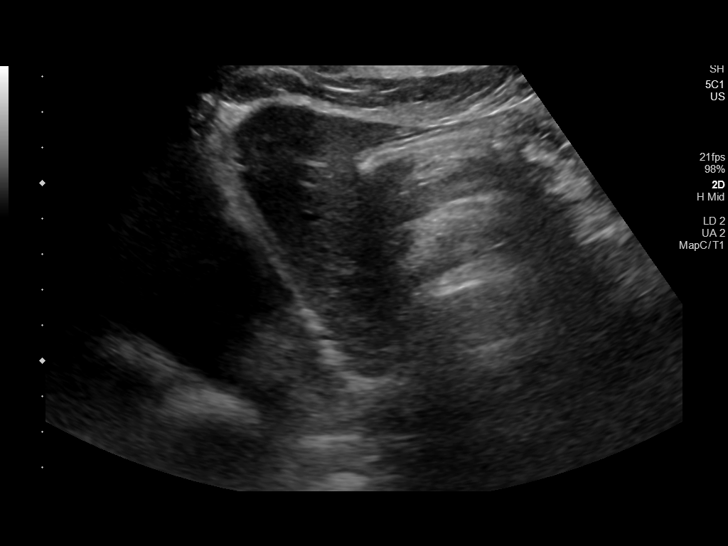
[im 5/58]
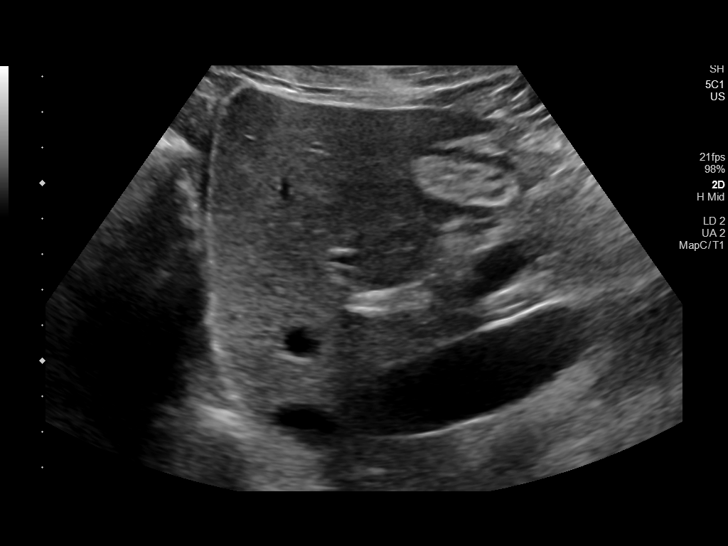
[im 10/58]
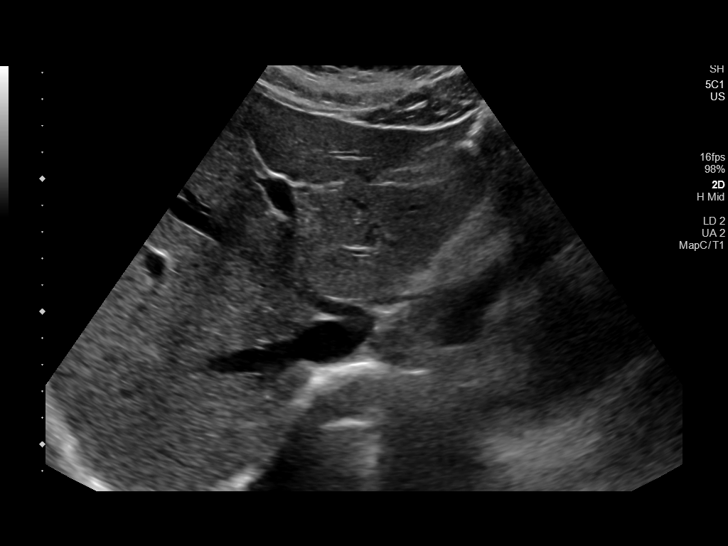
[im 15/58]
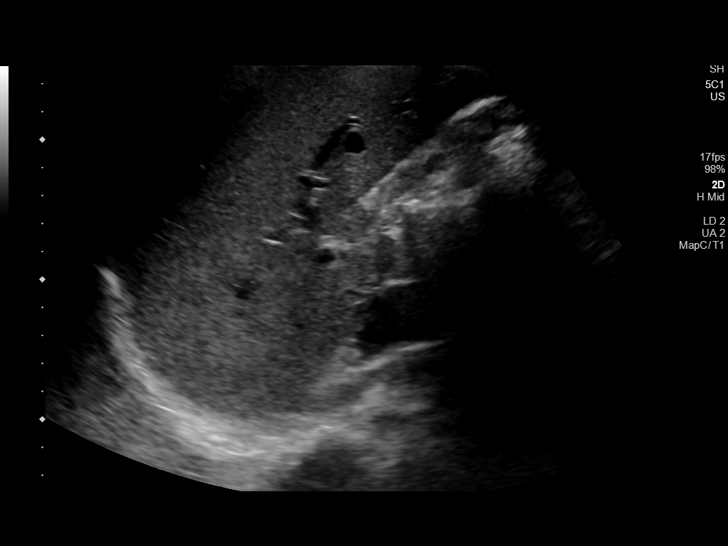
[im 20/58]
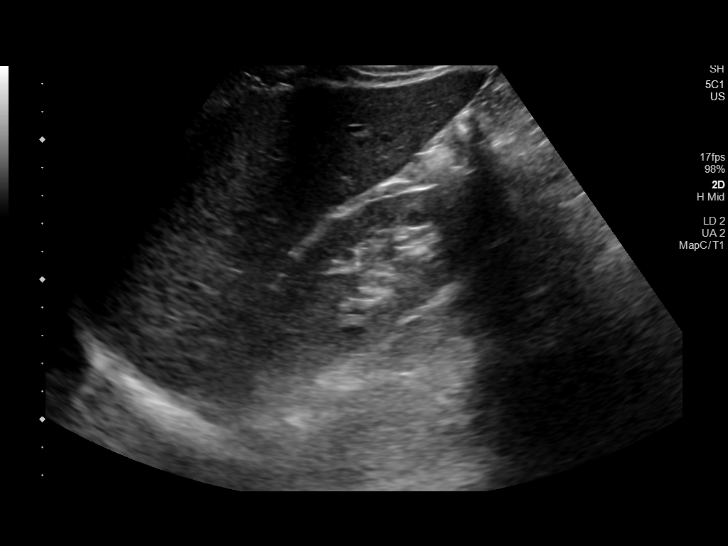
[im 22/58]
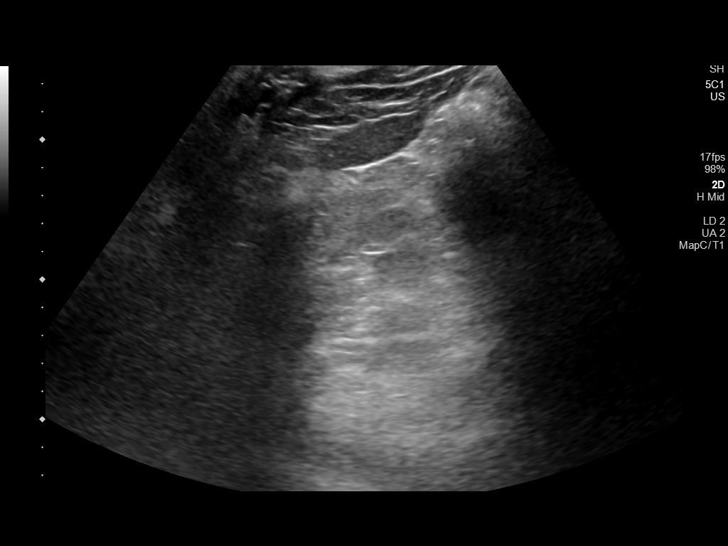
[im 27/58]
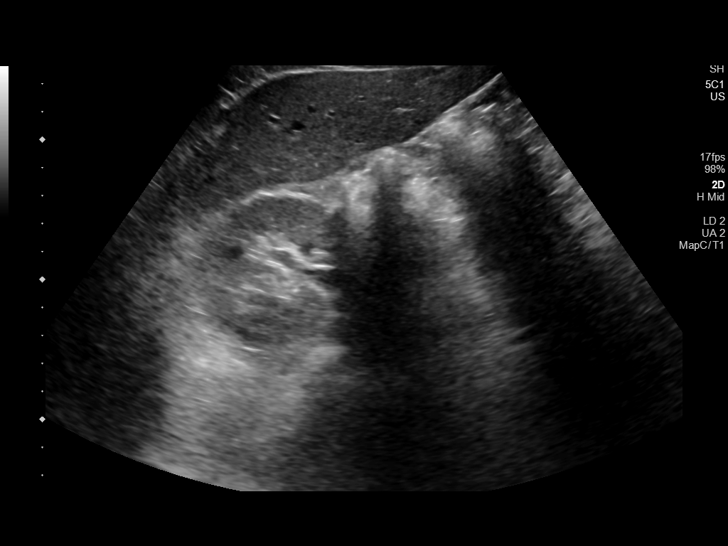
[im 31/58]
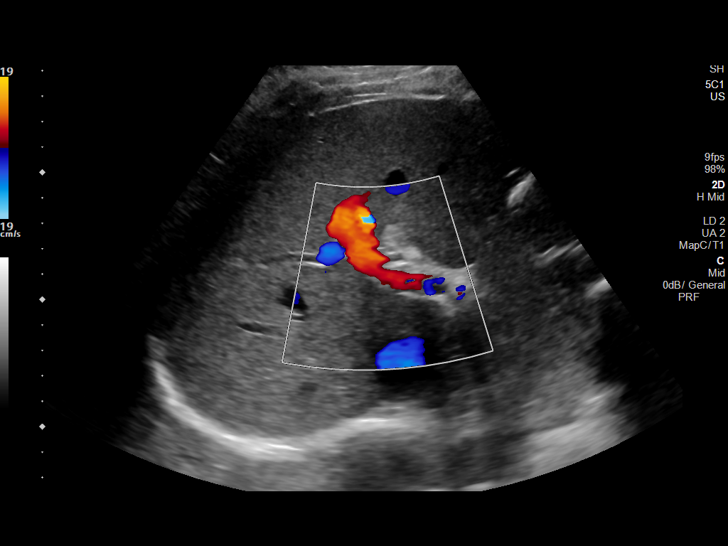
[im 36/58]
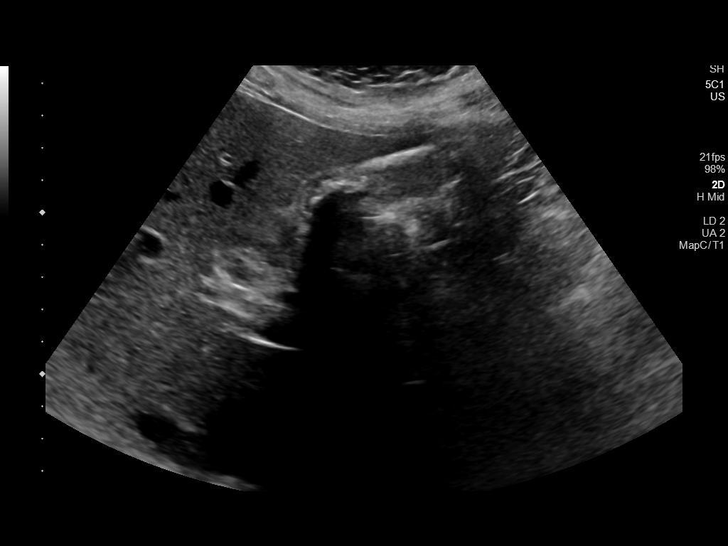
[im 39/58]
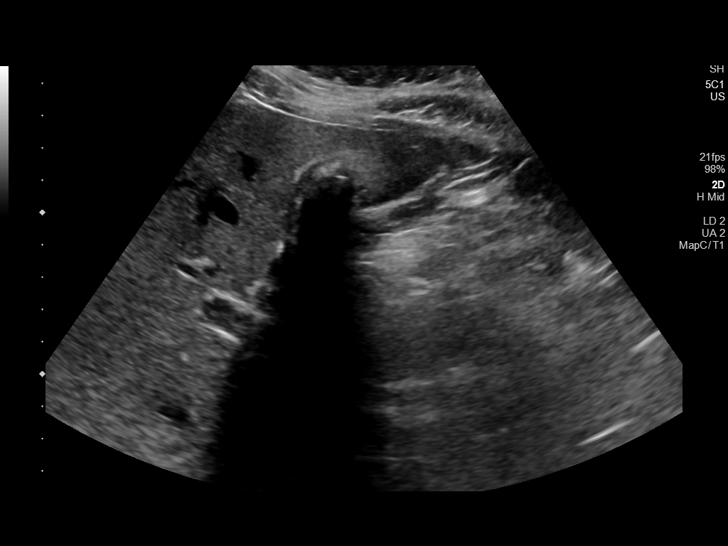
[im 43/58]
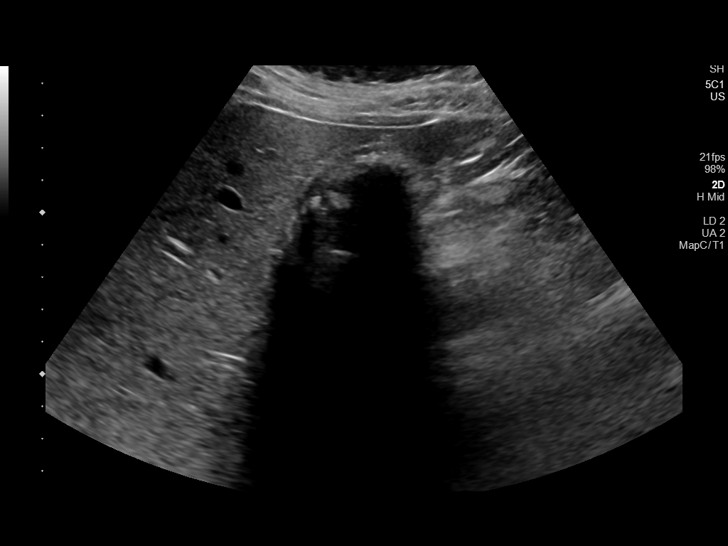
[im 48/58]
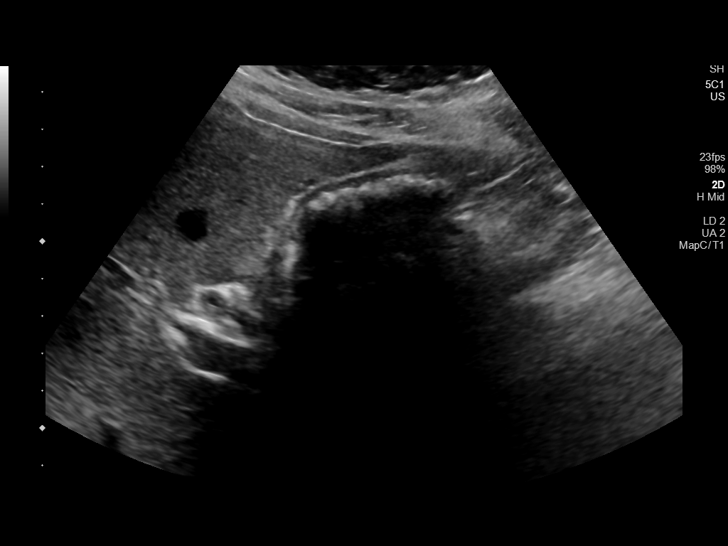
[im 53/58]
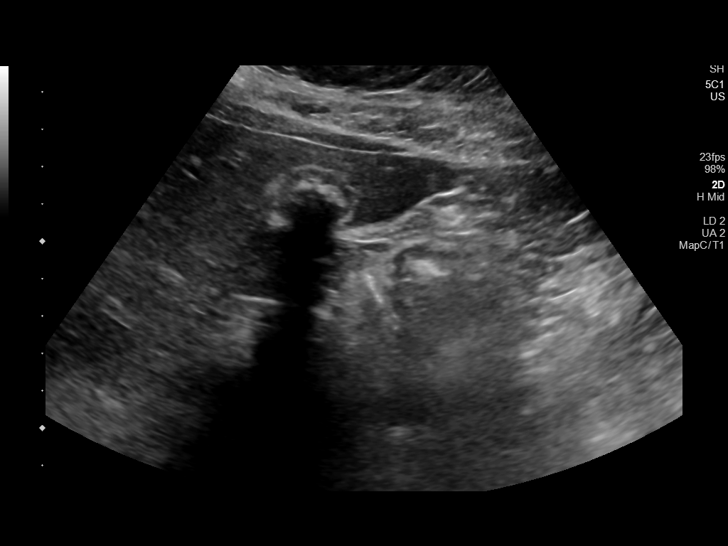
[im 58/58]
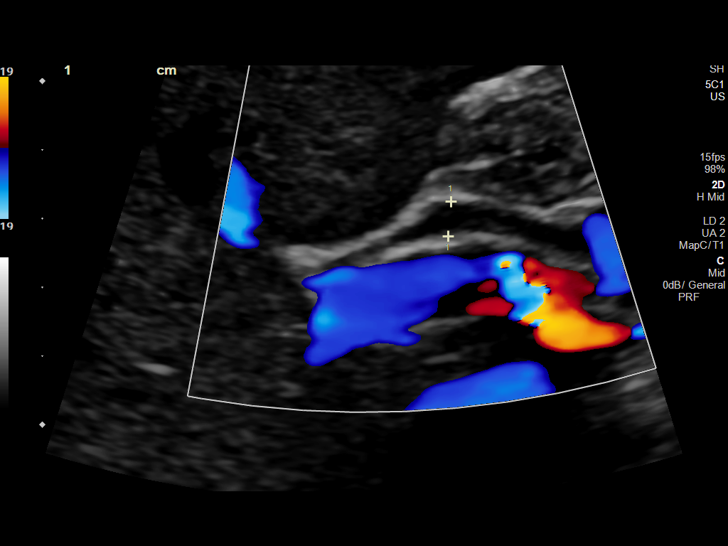

[14 of 25 positions shown; findings below may reference images not displayed]

FINDINGS: Gallbladder:

Wall-echo-shadow complex is seen in the gallbladder fossa,
consistent with gallstones filling the gallbladder lumen. This
limits visualization of the gallbladder wall. The nondependent
gallbladder wall shows borderline thickening measuring 3-4 mm,
however no sonographic Murphy sign noted by sonographer.

Common bile duct:

Diameter: 5 mm, within normal limits.

Liver:

No focal lesion identified. Within normal limits in parenchymal
echogenicity. Portal vein is patent on color Doppler imaging with
normal direction of blood flow towards the liver.

Other: None.
IMPRESSION: Cholelithiasis.

Borderline gallbladder wall thickening, however no sonographic
Murphy sign noted by sonographer. Consider nuclear medicine
hepatobiliary scan for further evaluation if clinically warranted.

No evidence of biliary ductal dilatation.

## 2023-04-03 ENCOUNTER — Ambulatory Visit
Admission: EM | Admit: 2023-04-03 | Discharge: 2023-04-03 | Disposition: A | Discharge status: Home / Self Care | Payer: BC Managed Care – PPO | Attending: Emergency Medicine | Admitting: Emergency Medicine

## 2023-04-03 DIAGNOSIS — R002 Palpitations: Secondary | ICD-10-CM

## 2023-04-03 NOTE — ED Triage Notes (Addendum)
Patient to Urgent Care with complaints of heart palpitations/ fluttering in her chest. Denies CP but does describe feeling uncomfortable. No SHOB.  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 Reports she stepped off the stage and felt an intense "fluttering" sensation in her chest. Today she felt a "pulling" on the right and left side of her chest.   Denies cardiac history or hx of HTN. Skin warm/ dry/ NAD at this time.

## 2023-04-03 NOTE — Discharge Instructions (Signed)
EKG shows that while heart is beating in a regular pace and rhythm it is occasionally throwing an additional beat which is most likely the palpitations that you are experiencing can also be heard when you are listening to  This is not a lethal rhythm such as atrial fibrillation  While palpitations are present avoid excessive caffeine intake as these can exacerbate your symptoms  However I would like you to follow-up with your primary doctor in 1 to 2 weeks for reevaluation of your symptoms  At any point if you feel that your symptoms are worsening and you begin to experience symptoms such as chest pain, shortness of breath, dizziness lightheadedness, visual changes please go to the nearest emergency department for immediate evaluation

## 2023-04-04 NOTE — ED Provider Notes (Signed)
[facility]    CSN: [id number] Arrival date & time: 04/03/23  1823      History   Chief Complaint Chief Complaint  Patient presents with   Palpitations    HPI Andrea Rojas is a 32 y.o. female.   Patient presents for evaluation of 2 occurrences of palpitations and a sensation of the heart fluttering beginning 1 day ago.  First occurrence occurred while singing in the church choir, lasted a few minutes before spontaneous resolution.  Had a sensation of the chest muscles being pulled on the right side during event radiating to the left side of the chest wall.  Feels as of the heart is skipping beats but denies tachycardia.  Had same occurrence this morning while sitting at work.  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  Denies shortness of breath, dizziness lightheadedness visual disturbance, headaches or syncope.  Denies personal cardiac history, familial history of hypertension.  Symptoms have not occurred before.  Has not attempted treatment.  Endorses approximately 2 to 3 cups of caffeine daily but primarily drinks water.  Non-smoker.   Past Medical History:  Diagnosis Date   Hx of varicella    Vaginal Pap smear, abnormal     Patient Active Problem List   Diagnosis Date Noted   Postpartum state 08/29/2013   Labor and delivery, indication for care 08/28/2013    Past Surgical History:  Procedure Laterality Date   CHOLECYSTECTOMY N/A 09/09/2021   Procedure: LAPAROSCOPIC CHOLECYSTECTOMY;  Surgeon: Axel Filler, MD;  Location: [facility] OR;  Service: General;  Laterality: N/A;   NO PAST SURGERIES      OB History     Gravida  2   Para  1   Term  1   Preterm      AB  1   Living  1      SAB      IAB  1   Ectopic      Multiple      Live Births  1            Home Medications    Prior to Admission medications   Medication Sig Start Date End Date Taking? Authorizing Provider   Multiple Vitamin (MULTIVITAMIN WITH MINERALS) TABS tablet Take 1 tablet by mouth once a week.    [provider]  norgestimate-ethinyl estradiol (ORTHO-CYCLEN) 0.25-35 MG-MCG tablet Take 1 tablet by mouth daily at 10 pm. 06/21/21   [provider]    Family History Family History  Problem Relation Age of Onset   Hyperlipidemia Father    Cancer Cousin        breast   Alcohol abuse Neg Hx    Arthritis Neg Hx    Asthma Neg Hx    Birth defects Neg Hx    COPD Neg Hx    Diabetes Neg Hx    Depression Neg Hx    Drug abuse Neg Hx    Early death Neg Hx    Hearing loss Neg Hx    Heart disease Neg Hx    Hypertension Neg Hx    Kidney disease Neg Hx    Learning disabilities Neg Hx    Mental illness Neg Hx    Mental retardation Neg Hx    Miscarriages / Stillbirths Neg Hx    Stroke Neg Hx    Vision loss Neg Hx     Social History Social History   Tobacco Use  Smoking status: Never   Smokeless tobacco: Never  Vaping Use   Vaping status: Never Used  Substance Use Topics   Alcohol use: Yes    Comment: very rare   Drug use: No     Allergies   Patient has no known allergies.   Review of Systems Review of Systems  Constitutional: Negative.   HENT: Negative.    Respiratory: Negative.    Cardiovascular:  Positive for palpitations. Negative for chest pain and leg swelling.  Skin: Negative.   Neurological: Negative.      Physical Exam Triage Vital Signs ED Triage Vitals  Encounter Vitals Group     BP 04/03/23 1923 (!) 139/90     Systolic BP Percentile --      Diastolic BP Percentile --      Pulse Rate 04/03/23 1923 (!) 57     Resp 04/03/23 1923 17     Temp 04/03/23 1923 97.7 F (36.5 C)     Temp src --      SpO2 04/03/23 1923 98 %     Weight --      Height --      Head Circumference --      Peak Flow --      Pain Score 04/03/23 1922 0     Pain Loc --      Pain Education --      Exclude from Growth Chart --    No data found.  Updated Vital  Signs BP (!) 139/90   Pulse (!) 57   Temp 97.7 F (36.5 C)   Resp 17   LMP 03/13/2023   SpO2 98%   Visual Acuity Right Eye Distance:   Left Eye Distance:   Bilateral Distance:    Right Eye Near:   Left Eye Near:    Bilateral Near:     Physical Exam Constitutional:      Appearance: Normal appearance.  Eyes:     Extraocular Movements: Extraocular movements intact.  Cardiovascular:     Rate and Rhythm: Rhythm irregular.     Pulses: Normal pulses.     Heart sounds: Normal heart sounds.  Pulmonary:     Effort: Pulmonary effort is normal.     Breath sounds: Normal breath sounds.  Skin:    General: Skin is warm and dry.  Neurological:     Mental Status: She is alert and oriented to person, place, and time. Mental status is at baseline.      UC Treatments / Results  Labs (all labs ordered are listed, but only abnormal results are displayed) Labs Reviewed - No data to display  EKG   Radiology No results found.  Procedures Procedures (including critical care time)  Medications Ordered in UC Medications - No data to display  Initial Impression / Assessment and Plan / UC Course  I have reviewed the triage vital signs and the nursing notes.  Pertinent labs & imaging results that were available during my care of the patient were reviewed by me and considered in my medical decision making (see chart for details).  Palpitations  EKG showing normal sinus arrhythmia, able to hear extra atrial beat to auscultation, discussed with patient, nonemergent rhythm, vitals are stable and she is in no signs of distress nontoxic-appearing, unknown etiology, advised monitoring and limiting caffeine intake, establish with PCP advised follow-up in 1 to 2 weeks, given strict precautions if symptoms worsen or she begins to experience any additional symptoms she is to go to the  nearest emergency department for immediate evaluation, verbalized understanding Final Clinical Impressions(s) /  UC Diagnoses   Final diagnoses:  Palpitations     Discharge Instructions      EKG shows that while heart is beating in a regular pace and rhythm it is occasionally throwing an additional beat which is most likely the palpitations that you are experiencing can also be heard when you are listening to  This is not a lethal rhythm such as atrial fibrillation  While palpitations are present avoid excessive caffeine intake as these can exacerbate your symptoms  However I would like you to follow-up with your primary doctor in 1 to 2 weeks for reevaluation of your symptoms  At any point if you feel that your symptoms are worsening and you begin to experience symptoms such as chest pain, shortness of breath, dizziness lightheadedness, visual changes please go to the nearest emergency department for immediate evaluation   ED Prescriptions   None    PDMP not reviewed this encounter.   Valinda Hoar, [place] 04/04/23 [id number]

## 2023-04-18 ENCOUNTER — Encounter: Payer: Self-pay | Admitting: Cardiology

## 2023-04-18 ENCOUNTER — Ambulatory Visit: Payer: BC Managed Care – PPO

## 2023-04-18 ENCOUNTER — Ambulatory Visit: Payer: BC Managed Care – PPO | Attending: Cardiology | Admitting: Cardiology

## 2023-04-18 VITALS — BP 126/96 | HR 71 | Ht 70.0 in | Wt 250.2 lb

## 2023-04-18 DIAGNOSIS — R002 Palpitations: Secondary | ICD-10-CM

## 2023-04-18 NOTE — Patient Instructions (Signed)
Medication Instructions:   Your physician recommends that you continue on your current medications as directed. Please refer to the Current Medication list given to you today.  *If you need a refill on your cardiac medications before your next appointment, please call your pharmacy*   Lab Work:  None Ordered  If you have labs (blood work) drawn today and your tests are completely normal, you will receive your results only by: MyChart Message (if you have MyChart) OR A paper copy in the mail If you have any lab test that is abnormal or we need to change your treatment, we will call you to review the results.   Testing/Procedures:  Your physician has recommended that you wear a Zio monitor.   This monitor is a medical device that records the heart's electrical activity. Doctors most often use these monitors to diagnose arrhythmias. Arrhythmias are problems with the speed or rhythm of the heartbeat. The monitor is a small device applied to your chest. You can wear one while you do your normal daily activities. While wearing this monitor if you have any symptoms to push the button and record what you felt. Once you have worn this monitor for the period of time provider prescribed (Usually 14 days), you will return the monitor device in the postage paid box. Once it is returned they will download the data collected and provide [place] with a report which the provider will then review and we will call you with those results. Important tips:  Avoid showering during the first 24 hours of wearing the monitor. Avoid excessive sweating to help maximize wear time. Do not submerge the device, no hot tubs, and no swimming pools. Keep any lotions or oils away from the patch. After 24 hours you may shower with the patch on. Take brief showers with your back facing the shower head.  Do not remove patch once it has been placed because that will interrupt data and decrease adhesive wear time. Push the button  when you have any symptoms and write down what you were feeling. Once you have completed wearing your monitor, remove and place into box which has postage paid and place in your outgoing mailbox.  If for some reason you have misplaced your box then call our office and we can provide another box and/or mail it off for you.   Follow-Up: At [facility], you and your health needs are our priority.  As part of our continuing mission to provide you with exceptional heart care, we have created designated Provider Care Teams.  These Care Teams include your primary Cardiologist (physician) and Advanced Practice Providers (APPs -  Physician Assistants and Nurse Practitioners) who all work together to provide you with the care you need, when you need it.  We recommend signing up for the patient portal called "MyChart".  Sign up information is provided on this After Visit Summary.  MyChart is used to connect with patients for Virtual Visits (Telemedicine).  Patients are able to view lab/test results, encounter notes, upcoming appointments, etc.  Non-urgent messages can be sent to your provider as well.   To learn more about what you can do with MyChart, go to [email or web address].    Your next appointment:    After ZIO  Provider:   You may see Debbe Odea, MD or one of the following Advanced Practice Providers on your designated Care Team:   Nicolasa Ducking, NP Eula Listen, PA-C Cadence Fransico Michael, PA-C Charlsie Quest, NP

## 2023-04-18 NOTE — Progress Notes (Signed)
Cardiology Office Note:    Date:  04/18/2023   ID:  Andrea Rojas, DOB 1990/08/31, MRN [id number]  PCP:  Jarrett Soho, PA-C   [facility] Providers Cardiologist:  Debbe Odea, MD     Referring MD: Jarrett Soho, PA-C   Chief Complaint  Patient presents with   New Patient (Initial Visit)    Referral for cardiac evaluation of Palpitations with no cardiac history.     Andrea Rojas is a 32 y.o. female who is being seen today for the evaluation of palpitations at the request of Jarrett Soho, [place].   History of Present Illness:    Andrea Rojas is a 32 y.o. female with no significant past medical history who presents due to palpitations.  States having palpitations over the past 2 weeks.  Symptoms usually occur when patient is walking.  Symptoms last a few seconds.  Denies chest pain or shortness of breath.  Drinks coffee maybe twice a week.  Denies any personal or family history of heart disease.  Otherwise doing okay.  Past Medical History:  Diagnosis Date   Anemia    Hx of varicella    Vaginal Pap smear, abnormal    Vitamin B12 deficiency     Past Surgical History:  Procedure Laterality Date   CHOLECYSTECTOMY N/A 09/09/2021   Procedure: LAPAROSCOPIC CHOLECYSTECTOMY;  Surgeon: Axel Filler, MD;  Location: [facility] OR;  Service: General;  Laterality: N/A;    Current Medications: Current Meds  Medication Sig   cyanocobalamin (VITAMIN B12) 1000 MCG tablet Take 1,000 mcg by mouth daily.   Multiple Vitamin (MULTIVITAMIN WITH MINERALS) TABS tablet Take 1 tablet by mouth once a week.   norgestimate-ethinyl estradiol (ORTHO-CYCLEN) 0.25-35 MG-MCG tablet Take 1 tablet by mouth daily at 10 pm.     Allergies:   Patient has no known allergies.   Social History   Socioeconomic History   Marital status: Single    Spouse name: Not on file   Number of children: 1   Years of education: Not on file   Highest education level: Not on file  Occupational History    Not on file  Tobacco Use   Smoking status: Never   Smokeless tobacco: Never  Vaping Use   Vaping status: Never Used  Substance and Sexual Activity   Alcohol use: Yes    Comment: very rare   Drug use: No   Sexual activity: Yes  Other Topics Concern   Not on file  Social History Narrative   Not on file   Social Determinants of Health   Financial Resource Strain: Not on file  Food Insecurity: Not on file  Transportation Needs: Not on file  Physical Activity: Not on file  Stress: Not on file  Social Connections: Not on file     Family History: The patient's family history includes Cancer in her cousin; Hyperlipidemia in her father. There is no history of Alcohol abuse, Arthritis, Asthma, Birth defects, COPD, Diabetes, Depression, Drug abuse, Early death, Hearing loss, Heart disease, Hypertension, Kidney disease, Learning disabilities, Mental illness, Mental retardation, Miscarriages / Stillbirths, Stroke, or Vision loss.  ROS:   Please see the history of present illness.     All other systems reviewed and are negative.  EKGs/Labs/Other Studies Reviewed:    The following studies were reviewed today:  EKG Interpretation Date/Time:  Tuesday April 18 2023 09:32:04 EDT Ventricular Rate:  71 PR Interval:  176 QRS Duration:  86 QT Interval:  402 QTC Calculation: 436 R Axis:  3  Text Interpretation: Normal sinus rhythm Minimal voltage criteria for LVH, may be normal variant ( R in aVL ) Confirmed by Debbe Odea ([id number]) on 04/18/2023 9:42:39 AM    Recent Labs: No results found for requested labs within last 365 days.  Recent Lipid Panel No results found for: "CHOL", "TRIG", "HDL", "CHOLHDL", "VLDL", "LDLCALC", "LDLDIRECT"   Risk Assessment/Calculations:    HYPERTENSION CONTROL Vitals:   04/18/23 0926 04/18/23 0934 04/18/23 0935  BP: (!) 132/94 (!) 130/96 (!) 126/96    The patient's blood pressure is elevated above target today.  In order to address the  patient's elevated BP: Blood pressure will be monitored at home to determine if medication changes need to be made.            Physical Exam:    VS:  BP (!) 126/96 (BP Location: Left Arm, Patient Position: Sitting, Cuff Size: Large)   Pulse 71   Ht 5\' 10"  (1.778 m)   Wt 250 lb 3.2 oz (113.5 kg)   LMP 03/13/2023   SpO2 98%   BMI 35.90 kg/m     Wt Readings from Last 3 Encounters:  04/18/23 250 lb 3.2 oz (113.5 kg)  09/09/21 230 lb (104.3 kg)  09/06/21 236 lb 8 oz (107.3 kg)     GEN:  Well nourished, well developed in no acute distress HEENT: Normal NECK: No JVD; No carotid bruits CARDIAC: RRR, no murmurs, rubs, gallops RESPIRATORY:  Clear to auscultation without rales, wheezing or rhonchi  ABDOMEN: Soft, non-tender, non-distended MUSCULOSKELETAL:  No edema; No deformity  SKIN: Warm and dry NEUROLOGIC:  Alert and oriented x 3 PSYCHIATRIC:  Normal affect   ASSESSMENT:    1. Palpitations    PLAN:    In order of problems listed above:  Palpitations, occurring with exertion.  Place cardiac monitor to evaluate any significant arrhythmias.  No significant symptoms such as dizziness, presyncope or syncope noted.  Denies chest pain or shortness of breath.  Follow-up after cardiac testing.       Medication Adjustments/Labs and Tests Ordered: Current medicines are reviewed at length with the patient today.  Concerns regarding medicines are outlined above.  Orders Placed This Encounter  Procedures   LONG TERM MONITOR (3-14 DAYS)   EKG 12-Lead   No orders of the defined types were placed in this encounter.   Patient Instructions  Medication Instructions:   Your physician recommends that you continue on your current medications as directed. Please refer to the Current Medication list given to you today.  *If you need a refill on your cardiac medications before your next appointment, please call your pharmacy*   Lab Work:  None Ordered  If you have labs (blood  work) drawn today and your tests are completely normal, you will receive your results only by: MyChart Message (if you have MyChart) OR A paper copy in the mail If you have any lab test that is abnormal or we need to change your treatment, we will call you to review the results.   Testing/Procedures:  Your physician has recommended that you wear a Zio monitor.   This monitor is a medical device that records the heart's electrical activity. Doctors most often use these monitors to diagnose arrhythmias. Arrhythmias are problems with the speed or rhythm of the heartbeat. The monitor is a small device applied to your chest. You can wear one while you do your normal daily activities. While wearing this monitor if you have any symptoms to push  the button and record what you felt. Once you have worn this monitor for the period of time provider prescribed (Usually 14 days), you will return the monitor device in the postage paid box. Once it is returned they will download the data collected and provide [place] with a report which the provider will then review and we will call you with those results. Important tips:  Avoid showering during the first 24 hours of wearing the monitor. Avoid excessive sweating to help maximize wear time. Do not submerge the device, no hot tubs, and no swimming pools. Keep any lotions or oils away from the patch. After 24 hours you may shower with the patch on. Take brief showers with your back facing the shower head.  Do not remove patch once it has been placed because that will interrupt data and decrease adhesive wear time. Push the button when you have any symptoms and write down what you were feeling. Once you have completed wearing your monitor, remove and place into box which has postage paid and place in your outgoing mailbox.  If for some reason you have misplaced your box then call our office and we can provide another box and/or mail it off for you.   Follow-Up: At  [facility], you and your health needs are our priority.  As part of our continuing mission to provide you with exceptional heart care, we have created designated Provider Care Teams.  These Care Teams include your primary Cardiologist (physician) and Advanced Practice Providers (APPs -  Physician Assistants and Nurse Practitioners) who all work together to provide you with the care you need, when you need it.  We recommend signing up for the patient portal called "MyChart".  Sign up information is provided on this After Visit Summary.  MyChart is used to connect with patients for Virtual Visits (Telemedicine).  Patients are able to view lab/test results, encounter notes, upcoming appointments, etc.  Non-urgent messages can be sent to your provider as well.   To learn more about what you can do with MyChart, go to [email or web address].    Your next appointment:    After ZIO  Provider:   You may see Debbe Odea, MD or one of the following Advanced Practice Providers on your designated Care Team:   Nicolasa Ducking, NP Eula Listen, PA-C Cadence Fransico Michael, PA-C Charlsie Quest, NP   Signed, Debbe Odea, MD  04/18/2023 10:13 AM    [facility]

## 2023-04-20 DIAGNOSIS — R002 Palpitations: Secondary | ICD-10-CM | POA: Diagnosis not present

## 2023-06-01 ENCOUNTER — Ambulatory Visit: Payer: BC Managed Care – PPO | Attending: Cardiology | Admitting: Cardiology

## 2023-06-01 ENCOUNTER — Encounter: Payer: Self-pay | Admitting: Cardiology

## 2023-06-01 VITALS — BP 128/90 | HR 84 | Ht 70.0 in | Wt 251.6 lb

## 2023-06-01 DIAGNOSIS — R002 Palpitations: Secondary | ICD-10-CM | POA: Diagnosis not present

## 2023-06-01 NOTE — Progress Notes (Signed)
Cardiology Office Note:    Date:  06/01/2023   ID:  Phi, Cerezo [birth year], MRN [id number]  PCP:  Jarrett Soho, PA-C   [facility] Providers Cardiologist:  Debbe Odea, MD     Referring MD: Jarrett Soho, PA-C   Chief Complaint  Patient presents with   Follow-up    Discuss cardiac testing results.       History of Present Illness:    Andrea Rojas is a 32 y.o. female with no significant past medical history who presents for follow-up.  Previously seen due to palpitations.  Cardiac monitor was placed to evaluate any significant arrhythmias.  Overall doing okay, no new concerns at this time.  Symptoms seem to have improved.  Endorses drinking teas in addition to occasional coffee about twice a week.  Presents for testing results.   Past Medical History:  Diagnosis Date   Anemia    Hx of varicella    Vaginal Pap smear, abnormal    Vitamin B12 deficiency     Past Surgical History:  Procedure Laterality Date   CHOLECYSTECTOMY N/A 09/09/2021   Procedure: LAPAROSCOPIC CHOLECYSTECTOMY;  Surgeon: Axel Filler, MD;  Location: [facility] OR;  Service: General;  Laterality: N/A;    Current Medications: Current Meds  Medication Sig   cyanocobalamin (VITAMIN B12) 1000 MCG tablet Take 1,000 mcg by mouth daily.   Multiple Vitamin (MULTIVITAMIN WITH MINERALS) TABS tablet Take 1 tablet by mouth once a week.   norgestimate-ethinyl estradiol (ORTHO-CYCLEN) 0.25-35 MG-MCG tablet Take 1 tablet by mouth daily at 10 pm.     Allergies:   Patient has no known allergies.   Social History   Socioeconomic History   Marital status: Single    Spouse name: Not on file   Number of children: 1   Years of education: Not on file   Highest education level: Not on file  Occupational History   Not on file  Tobacco Use   Smoking status: Never   Smokeless tobacco: Never  Vaping Use   Vaping status: Never Used  Substance and Sexual Activity   Alcohol use: Yes     Comment: very rare   Drug use: No   Sexual activity: Yes  Other Topics Concern   Not on file  Social History Narrative   Not on file   Social Determinants of Health   Financial Resource Strain: Not on file  Food Insecurity: Not on file  Transportation Needs: Not on file  Physical Activity: Not on file  Stress: Not on file  Social Connections: Not on file     Family History: The patient's family history includes Cancer in her cousin; Hyperlipidemia in her father. There is no history of Alcohol abuse, Arthritis, Asthma, Birth defects, COPD, Diabetes, Depression, Drug abuse, Early death, Hearing loss, Heart disease, Hypertension, Kidney disease, Learning disabilities, Mental illness, Mental retardation, Miscarriages / Stillbirths, Stroke, or Vision loss.  ROS:   Please see the history of present illness.     All other systems reviewed and are negative.  EKGs/Labs/Other Studies Reviewed:    The following studies were reviewed today:       Recent Labs: No results found for requested labs within last 365 days.  Recent Lipid Panel No results found for: "CHOL", "TRIG", "HDL", "CHOLHDL", "VLDL", "LDLCALC", "LDLDIRECT"   Risk Assessment/Calculations:    HYPERTENSION CONTROL Vitals:   06/01/23 0910 06/01/23 0916  BP: (!) 148/94 (!) 128/90    The patient's blood pressure is elevated above target today.  In order to address the patient's elevated BP:             Physical Exam:    VS:  BP (!) 128/90 (BP Location: Left Arm, Patient Position: Sitting, Cuff Size: Large)   Pulse 84   Ht 5\' 10"  (1.778 m)   Wt 251 lb 9.6 oz (114.1 kg)   SpO2 98%   BMI 36.10 kg/m     Wt Readings from Last 3 Encounters:  06/01/23 251 lb 9.6 oz (114.1 kg)  04/18/23 250 lb 3.2 oz (113.5 kg)  09/09/21 230 lb (104.3 kg)     GEN:  Well nourished, well developed in no acute distress HEENT: Normal NECK: No JVD; No carotid bruits CARDIAC: RRR, no murmurs, rubs, gallops RESPIRATORY:  Clear to  auscultation without rales, wheezing or rhonchi  ABDOMEN: Soft, non-tender, non-distended MUSCULOSKELETAL:  No edema; No deformity  SKIN: Warm and dry NEUROLOGIC:  Alert and oriented x 3 PSYCHIATRIC:  Normal affect   ASSESSMENT:    1. Palpitations    PLAN:    In order of problems listed above:  Palpitations, cardiac monitor showed no significant arrhythmias.  No A-fib or atrial flutter, no SVT.  Overall benign cardiac monitor.  Cutting back on caffeine may improve symptoms.  Overall symptoms not significant.  Patient made aware of results, reassured.  Follow-up as needed.       Medication Adjustments/Labs and Tests Ordered: Current medicines are reviewed at length with the patient today.  Concerns regarding medicines are outlined above.  No orders of the defined types were placed in this encounter.  No orders of the defined types were placed in this encounter.   Patient Instructions  Medication Instructions:   Your physician recommends that you continue on your current medications as directed. Please refer to the Current Medication list given to you today.  *If you need a refill on your cardiac medications before your next appointment, please call your pharmacy*   Lab Work:  None Ordered  If you have labs (blood work) drawn today and your tests are completely normal, you will receive your results only by: MyChart Message (if you have MyChart) OR A paper copy in the mail If you have any lab test that is abnormal or we need to change your treatment, we will call you to review the results.   Testing/Procedures:  None Ordered    Follow-Up: At [facility] you and your health needs are our priority.  As part of our continuing mission to provide you with exceptional heart care, we have created designated Provider Care Teams.  These Care Teams include your primary Cardiologist (physician) and Advanced Practice Providers (APPs -  Physician Assistants and Nurse  Practitioners) who all work together to provide you with the care you need, when you need it.  We recommend signing up for the patient portal called "MyChart".  Sign up information is provided on this After Visit Summary.  MyChart is used to connect with patients for Virtual Visits (Telemedicine).  Patients are able to view lab/test results, encounter notes, upcoming appointments, etc.  Non-urgent messages can be sent to your provider as well.   To learn more about what you can do with MyChart, go to [email or web address].    Your next appointment:    As needed    Signed, Debbe Odea, MD  06/01/2023 10:03 AM    [facility]

## 2023-06-01 NOTE — Patient Instructions (Signed)
Medication Instructions:   Your physician recommends that you continue on your current medications as directed. Please refer to the Current Medication list given to you today.  *If you need a refill on your cardiac medications before your next appointment, please call your pharmacy*   Lab Work:  None Ordered  If you have labs (blood work) drawn today and your tests are completely normal, you will receive your results only by: MyChart Message (if you have MyChart) OR A paper copy in the mail If you have any lab test that is abnormal or we need to change your treatment, we will call you to review the results.   Testing/Procedures:  None Ordered   Follow-Up: At [facility], you and your health needs are our priority.  As part of our continuing mission to provide you with exceptional heart care, we have created designated Provider Care Teams.  These Care Teams include your primary Cardiologist (physician) and Advanced Practice Providers (APPs -  Physician Assistants and Nurse Practitioners) who all work together to provide you with the care you need, when you need it.  We recommend signing up for the patient portal called "MyChart".  Sign up information is provided on this After Visit Summary.  MyChart is used to connect with patients for Virtual Visits (Telemedicine).  Patients are able to view lab/test results, encounter notes, upcoming appointments, etc.  Non-urgent messages can be sent to your provider as well.   To learn more about what you can do with MyChart, go to [email or web address].    Your next appointment:    As needed 

## 2023-07-10 ENCOUNTER — Telehealth: Payer: BC Managed Care – PPO

## 2023-07-10 DIAGNOSIS — L509 Urticaria, unspecified: Secondary | ICD-10-CM | POA: Diagnosis not present

## 2023-07-10 MED ORDER — PREDNISONE 10 MG PO TABS: ORAL_TABLET | ORAL | 0 refills | Status: DC

## 2023-07-10 NOTE — Progress Notes (Signed)
Virtual Visit Consent   Andrea Rojas, you are scheduled for a virtual visit with a [facility] provider today. Just as with appointments in the office, your consent must be obtained to participate. Your consent will be active for this visit and any virtual visit you may have with one of our providers in the next 365 days. If you have a MyChart account, a copy of this consent can be sent to you electronically.  As this is a virtual visit, video technology does not allow for your provider to perform a traditional examination. This may limit your provider's ability to fully assess your condition. If your provider identifies any concerns that need to be evaluated in person or the need to arrange testing (such as labs, EKG, etc.), we will make arrangements to do so. Although advances in technology are sophisticated, we cannot ensure that it will always work on either your end or our end. If the connection with a video visit is poor, the visit may have to be switched to a telephone visit. With either a video or telephone visit, we are not always able to ensure that we have a secure connection.  By engaging in this virtual visit, you consent to the provision of healthcare and authorize for your insurance to be billed (if applicable) for the services provided during this visit. Depending on your insurance coverage, you may receive a charge related to this service.  I need to obtain your verbal consent now. Are you willing to proceed with your visit today? Donnalee Wurts has provided verbal consent on 07/10/2023 for a virtual visit (video or telephone). Margaretann Loveless, PA-C  Date: 07/10/2023 9:09 AM  Virtual Visit via Video Note   I, Margaretann Loveless, connected with  Bertina Ogle  ([id number], [birth year]) on 07/10/23 at  9:00 AM EST by a video-enabled telemedicine application and verified that I am speaking with the correct person using two identifiers.  Location: Patient: Virtual Visit Location Patient:  Other: work; isolated Provider: Engineer, mining Provider: Home Office   I discussed the limitations of evaluation and management by telemedicine and the availability of in person appointments. The patient expressed understanding and agreed to proceed.    History of Present Illness: Andrea Rojas is a 32 y.o. who identifies as a female who was assigned female at birth, and is being seen today for hives.  HPI: Urticaria This is a new problem. The current episode started in the past 7 days (Started night before Thanksgiving). The problem is unchanged. The affected locations include the right arm, left arm and face. The rash is characterized by itchiness and redness. She was exposed to nothing. Pertinent negatives include no congestion, cough, facial edema, fever, shortness of breath or sore throat. Past treatments include antihistamine (benadryl). The treatment provided no relief.     Problems:  Patient Active Problem List   Diagnosis Date Noted   Postpartum state 08/29/2013   Labor and delivery, indication for care 08/28/2013    Allergies: No Known Allergies Medications:  Current Outpatient Medications:    predniSONE (DELTASONE) 10 MG tablet, Days 1-4 take 4 tablets (40 mg) daily  Days 5-8 take 3 tablets (30 mg) daily, Days 9-11 take 2 tablets (20 mg) daily, Days 12-14 take 1 tablet (10 mg) daily., Disp: 37 tablet, Rfl: 0   cyanocobalamin (VITAMIN B12) 1000 MCG tablet, Take 1,000 mcg by mouth daily., Disp: , Rfl:    Multiple Vitamin (MULTIVITAMIN WITH MINERALS) TABS tablet, Take 1 tablet by mouth  once a week., Disp: , Rfl:    norgestimate-ethinyl estradiol (ORTHO-CYCLEN) 0.25-35 MG-MCG tablet, Take 1 tablet by mouth daily at 10 pm., Disp: , Rfl:   Observations/Objective: Patient is well-developed, well-nourished in no acute distress.  Resting comfortably  Head is normocephalic, atraumatic.  No labored breathing.  Speech is clear and coherent with logical content.  Patient is alert  and oriented at baseline.    Assessment and Plan: 1. Urticaria - predniSONE (DELTASONE) 10 MG tablet; Days 1-4 take 4 tablets (40 mg) daily  Days 5-8 take 3 tablets (30 mg) daily, Days 9-11 take 2 tablets (20 mg) daily, Days 12-14 take 1 tablet (10 mg) daily.  Dispense: 37 tablet; Refill: 0  Based on what you shared with me it looks like you have Urticaria (hives).  Hives are itchy red or white bumps on the skin. This itchy rash is also known as urticaria, or as nettle rash. In some cases this itchy rash is triggered by a physical stimulus. If this is the case, the condition is called inducible urticaria or physical urticaria. Examples of physical factors which can trigger hives include pressure, friction, sweating, cold, heat, sunlight and water.   Treatments include avoiding the trigger (where possible), and antihistamines. Urticaria can be called acute (short-lived episode) or chronic (persisting).   I am prescribing a two week course of steroids (37 tablets of 10 mg prednisone).  Days 1-4 take 4 tablets (40 mg) daily  Days 5-8 take 3 tablets (30 mg) daily, Days 9-11 take 2 tablets (20 mg) daily, Days 12-14 take 1 tablet (10 mg) daily.    Follow Up Instructions: I discussed the assessment and treatment plan with the patient. The patient was provided an opportunity to ask questions and all were answered. The patient agreed with the plan and demonstrated an understanding of the instructions.  A copy of instructions were sent to the patient via MyChart unless otherwise noted below.    The patient was advised to call back or seek an in-person evaluation if the symptoms worsen or if the condition fails to improve as anticipated.    Margaretann Loveless, PA-C

## 2023-07-10 NOTE — Patient Instructions (Signed)
Andrea Rojas, thank you for joining Margaretann Loveless, PA-C for today's virtual visit.  While this provider is not your primary care provider (PCP), if your PCP is located in our provider database this encounter information will be shared with them immediately following your visit.   A [facility] MyChart account gives you access to today's visit and all your visits, tests, and labs performed at [facility] " click here if you don't have a [facility] MyChart account or go to [email or web address]  Consent: (Patient) Andrea Rojas provided verbal consent for this virtual visit at the beginning of the encounter.  Current Medications:  Current Outpatient Medications:    predniSONE (DELTASONE) 10 MG tablet, Days 1-4 take 4 tablets (40 mg) daily  Days 5-8 take 3 tablets (30 mg) daily, Days 9-11 take 2 tablets (20 mg) daily, Days 12-14 take 1 tablet (10 mg) daily., Disp: 37 tablet, Rfl: 0   cyanocobalamin (VITAMIN B12) 1000 MCG tablet, Take 1,000 mcg by mouth daily., Disp: , Rfl:    Multiple Vitamin (MULTIVITAMIN WITH MINERALS) TABS tablet, Take 1 tablet by mouth once a week., Disp: , Rfl:    norgestimate-ethinyl estradiol (ORTHO-CYCLEN) 0.25-35 MG-MCG tablet, Take 1 tablet by mouth daily at 10 pm., Disp: , Rfl:    Medications ordered in this encounter:  Meds ordered this encounter  Medications   predniSONE (DELTASONE) 10 MG tablet    Sig: Days 1-4 take 4 tablets (40 mg) daily  Days 5-8 take 3 tablets (30 mg) daily, Days 9-11 take 2 tablets (20 mg) daily, Days 12-14 take 1 tablet (10 mg) daily.    Dispense:  37 tablet    Refill:  0    Order Specific Question:   Supervising Provider    Answer:   Merrilee Jansky [id number]     *If you need refills on other medications prior to your next appointment, please contact your pharmacy*  Follow-Up: Call back or seek an in-person evaluation if the symptoms worsen or if the condition fails to improve as anticipated.  [facility] [phone]  Other Instructions  Hives Hives (urticaria) are itchy, red, swollen areas of skin. They can show up on any part of the body. They often fade within 24 hours (acute hives). If you get new hives after the old ones fade and the cycle goes on for many days or weeks, it is called chronic hives. Hives do not spread from person to person (are not contagious). Hives can happen when your body reacts to something you are allergic to (allergen) or to something that irritates your skin. When you are exposed to something that triggers hives, your body releases a chemical called histamine. This causes redness, itching, and swelling. Hives can show up right after you are exposed to a trigger or hours later. What are the causes? Hives may be caused by: Food allergies. Insect bites or stings. Allergies to pollen or pets. Spending time in sunlight, heat, or cold (exposure). Exercise. Stress. You can also get hives from other conditions and treatments. These include: Viruses, such as the common cold. Bacterial infections, such as urinary tract infections and strep throat. Certain medicines. Contact with latex or chemicals. Allergy shots. Blood transfusions. In some cases, the cause of hives is not known (idiopathic hives). What increases the risk? You are more likely to get hives if: You are female. You have food allergies. Hives are more common if you are allergic to citrus fruits, milk, eggs, peanuts, tree nuts,  or shellfish. You are allergic to: Medicines. Latex. Insects. Animals. Pollen. What are the signs or symptoms? Common symptoms of hives include raised, itchy, red or white bumps or patches on your skin. These areas may: Become large and swollen (welts). Quickly change shape and location. This may happen more than once. Be separate hives or connect over a large area of skin. Sting or become painful. Turn white when pressed in the center (blanch). In severe cases,  your hands, feet, and face may also become swollen. This may happen if hives form deeper in your skin. How is this diagnosed? Hives may be diagnosed based on your symptoms, medical history, and a physical exam. You may have skin, pee (urine), or blood tests done. These can help find out what is causing your hives and rule out other health issues. You may also have a biopsy done. This is when a small piece of skin is removed for testing. How is this treated? Treatment for hives depends on the cause and on how severe your symptoms are. You may be told to use cool, wet cloths (cool compresses) or to take cool showers to relieve itching. Treatment may also include: Medicines to help: Relieve itching (antihistamines). Reduce swelling (corticosteroids). Treat infection (antibiotics). An injectable medicine called omalizumab. You may need this if you have chronic idiopathic hives and still have symptoms even after you are treated with antihistamines. In severe cases, you may need to use a device filled with medicine that gives an emergency shot of epinephrine (auto-injector pen) to prevent a very bad allergic reaction (anaphylactic reaction). Follow these instructions at home: Medicines Take and apply over-the-counter and prescription medicines only as told by your health care provider. If you were prescribed antibiotics, take them as told by your provider. Do not stop using the antibiotic even if you start to feel better. Skin care Apply cool compresses to the affected areas. Do not scratch or rub your skin. General instructions Do not take hot showers or baths. This can make itching worse. Do not wear tight-fitting clothing. Use sunscreen. Wear protective clothing when you are outside. Avoid anything that causes your hives. Keep a journal to help track what causes your hives. Write down: What medicines you take. What you eat and drink. What products you use on your skin. Keep all follow-up  visits. Your provider will track how well treatment is working. Contact a health care provider if: Your symptoms do not get better with medicine. Your joints are painful or swollen. You have a fever. You have pain in your abdomen. Get help right away if: Your tongue, lips, or eyelids swell. Your chest or throat feels tight. You have trouble breathing or swallowing. These symptoms may be an emergency. Use the auto-injector pen right away. Then call 911. Do not wait to see if the symptoms will go away. Do not drive yourself to the hospital. This information is not intended to replace advice given to you by your health care provider. Make sure you discuss any questions you have with your health care provider. Document Revised: 04/21/2022 Document Reviewed: 04/12/2022 Elsevier Patient Education  2024 Elsevier Inc.    If you have been instructed to have an in-person evaluation today at a local Urgent Care facility, please use the link below. It will take you to a list of all of our available [facility], including address, phone number and hours of operation. Please do not delay care.   Urgent Cares  If you or  a family member do not have a primary care provider, use the link below to schedule a visit and establish care. When you choose a [facility] primary care physician or advanced practice provider, you gain a long-term partner in health. Find a Primary Care Provider  Learn more about [facility]'s in-office and virtual care options:  - Get Care Now

## 2023-12-05 ENCOUNTER — Other Ambulatory Visit: Payer: Self-pay | Admitting: Family Medicine

## 2023-12-05 DIAGNOSIS — N644 Mastodynia: Secondary | ICD-10-CM

## 2023-12-15 ENCOUNTER — Ambulatory Visit: Payer: Self-pay

## 2023-12-15 ENCOUNTER — Ambulatory Visit
Admission: RE | Admit: 2023-12-15 | Discharge: 2023-12-15 | Disposition: A | Discharge status: Home / Self Care | Payer: Self-pay | Source: Ambulatory Visit | Attending: Family Medicine | Admitting: Family Medicine

## 2023-12-15 DIAGNOSIS — N644 Mastodynia: Secondary | ICD-10-CM

## 2023-12-19 ENCOUNTER — Ambulatory Visit
Admission: EM | Admit: 2023-12-19 | Discharge: 2023-12-19 | Disposition: A | Discharge status: Home / Self Care | Attending: Emergency Medicine | Admitting: Emergency Medicine

## 2023-12-19 DIAGNOSIS — B349 Viral infection, unspecified: Secondary | ICD-10-CM

## 2023-12-19 LAB — POC COVID19/FLU A&B COMBO
Covid Antigen, POC: NEGATIVE
Influenza A Antigen, POC: NEGATIVE
Influenza B Antigen, POC: NEGATIVE

## 2023-12-19 LAB — POCT RAPID STREP A (OFFICE): Rapid Strep A Screen: NEGATIVE

## 2023-12-19 NOTE — ED Triage Notes (Signed)
 Patient to Urgent Care with complaints of nasal congestion/ fevers/ body aches/ sore throat.   Symptoms started yesterday.

## 2023-12-19 NOTE — Discharge Instructions (Addendum)
 The strep, COVID and flu tests are negative.   Take Tylenol or ibuprofen as needed for fever or discomfort.  Take plain Mucinex as needed for congestion.  Rest and keep yourself hydrated.    Follow-up with your primary care provider if your symptoms are not improving.

## 2023-12-19 NOTE — ED Provider Notes (Signed)
 Arlander Bellman    CSN: [id number] Arrival date & time: 12/19/23  1220      History   Chief Complaint Chief Complaint  Patient presents with   Nasal Congestion    HPI Karolee Meske is a 33 y.o. female.  Patient presents with fever, body aches, sore throat, congestion, headache since yesterday.  No rash, cough, shortness of breath, vomiting, diarrhea.  She took ibuprofen  this morning at 0900.  The history is provided by the patient and medical records.    Past Medical History:  Diagnosis Date   Anemia    Hx of varicella    Vaginal Pap smear, abnormal    Vitamin B12 deficiency     Patient Active Problem List   Diagnosis Date Noted   Postpartum state 08/29/2013   Labor and delivery, indication for care 08/28/2013    Past Surgical History:  Procedure Laterality Date   CHOLECYSTECTOMY N/A 09/09/2021   Procedure: LAPAROSCOPIC CHOLECYSTECTOMY;  Surgeon: Shela Derby, MD;  Location: [facility] OR;  Service: General;  Laterality: N/A;    OB History     Gravida  2   Para  1   Term  1   Preterm      AB  1   Living  1      SAB      IAB  1   Ectopic      Multiple      Live Births  1            Home Medications    Prior to Admission medications   Medication Sig Start Date End Date Taking? Authorizing Provider  cyanocobalamin (VITAMIN B12) 1000 MCG tablet Take 1,000 mcg by mouth daily.    [provider]  Multiple Vitamin (MULTIVITAMIN WITH MINERALS) TABS tablet Take 1 tablet by mouth once a week.    [provider]  norgestimate-ethinyl estradiol (ORTHO-CYCLEN) 0.25-35 MG-MCG tablet Take 1 tablet by mouth daily at 10 pm. 06/21/21   [provider]  predniSONE  (DELTASONE ) 10 MG tablet Days 1-4 take 4 tablets (40 mg) daily  Days 5-8 take 3 tablets (30 mg) daily, Days 9-11 take 2 tablets (20 mg) daily, Days 12-14 take 1 tablet (10 mg) daily. Patient not taking: Reported on 12/19/2023 07/10/23   Angelia Kelp, PA-C     Family History Family History  Problem Relation Age of Onset   Hyperlipidemia Father    Cancer Cousin        breast   Alcohol abuse Neg Hx    Arthritis Neg Hx    Asthma Neg Hx    Birth defects Neg Hx    COPD Neg Hx    Diabetes Neg Hx    Depression Neg Hx    Drug abuse Neg Hx    Early death Neg Hx    Hearing loss Neg Hx    Heart disease Neg Hx    Hypertension Neg Hx    Kidney disease Neg Hx    Learning disabilities Neg Hx    Mental illness Neg Hx    Mental retardation Neg Hx    Miscarriages / Stillbirths Neg Hx    Stroke Neg Hx    Vision loss Neg Hx     Social History Social History   Tobacco Use   Smoking status: Never   Smokeless tobacco: Never  Vaping Use   Vaping status: Never Used  Substance Use Topics   Alcohol use: Yes    Comment: very rare  Drug use: No     Allergies   Patient has no known allergies.   Review of Systems Review of Systems  Constitutional:  Positive for fever. Negative for chills.  HENT:  Positive for sore throat. Negative for ear pain.   Respiratory:  Negative for cough and shortness of breath.   Gastrointestinal:  Negative for diarrhea and vomiting.  Neurological:  Positive for headaches.     Physical Exam Triage Vital Signs ED Triage Vitals  Encounter Vitals Group     BP 12/19/23 1246 139/88     Systolic BP Percentile --      Diastolic BP Percentile --      Pulse Rate 12/19/23 1246 100     Resp 12/19/23 1246 18     Temp 12/19/23 1246 99.1 F (37.3 C)     Temp src --      SpO2 12/19/23 1246 98 %     Weight --      Height --      Head Circumference --      Peak Flow --      Pain Score 12/19/23 1245 3     Pain Loc --      Pain Education --      Exclude from Growth Chart --    No data found.  Updated Vital Signs BP 139/88   Pulse 100   Temp 99.1 F (37.3 C)   Resp 18   LMP 11/24/2023   SpO2 98%   Visual Acuity Right Eye Distance:   Left Eye Distance:   Bilateral Distance:    Right Eye Near:    Left Eye Near:    Bilateral Near:     Physical Exam Constitutional:      General: She is not in acute distress. HENT:     Right Ear: Tympanic membrane normal.     Left Ear: Tympanic membrane normal.     Nose: Congestion and rhinorrhea present.     Mouth/Throat:     Mouth: Mucous membranes are moist.     Pharynx: Posterior oropharyngeal erythema present.  Cardiovascular:     Rate and Rhythm: Normal rate and regular rhythm.     Heart sounds: Normal heart sounds.  Pulmonary:     Effort: Pulmonary effort is normal. No respiratory distress.     Breath sounds: Normal breath sounds.  Neurological:     Mental Status: She is alert.      UC Treatments / Results  Labs (all labs ordered are listed, but only abnormal results are displayed) Labs Reviewed  POC COVID19/FLU A&B COMBO  POCT RAPID STREP A (OFFICE)    EKG   Radiology No results found.  Procedures Procedures (including critical care time)  Medications Ordered in UC Medications - No data to display  Initial Impression / Assessment and Plan / UC Course  I have reviewed the triage vital signs and the nursing notes.  Pertinent labs & imaging results that were available during my care of the patient were reviewed by me and considered in my medical decision making (see chart for details).   Viral illness.  Rapid strep negative. Rapid COVID and flu negative.  Discussed symptomatic treatment including Tylenol  or ibuprofen  as needed for fever or discomfort, plain Mucinex as needed for congestion, rest, hydration.  Instructed patient to follow-up with PCP if not improving.  ED precautions given.  Patient agrees to plan of care.    Final Clinical Impressions(s) / UC Diagnoses   Final diagnoses:  Viral illness     Discharge Instructions      The strep, COVID and flu tests are negative.   Take Tylenol  or ibuprofen  as needed for fever or discomfort.  Take plain Mucinex as needed for congestion.  Rest and keep yourself  hydrated.    Follow-up with your primary care provider if your symptoms are not improving.       ED Prescriptions   None    PDMP not reviewed this encounter.   Wellington Half, NP 12/19/23 1310

## 2024-09-02 ENCOUNTER — Ambulatory Visit
Admission: EM | Admit: 2024-09-02 | Discharge: 2024-09-02 | Disposition: A | Discharge status: Home / Self Care | Attending: Emergency Medicine | Admitting: Emergency Medicine

## 2024-09-02 ENCOUNTER — Emergency Department: Admission: EM | Admit: 2024-09-02 | Discharge: 2024-09-02 | Disposition: A | Discharge status: Home / Self Care

## 2024-09-02 ENCOUNTER — Encounter: Payer: Self-pay | Admitting: Emergency Medicine

## 2024-09-02 ENCOUNTER — Emergency Department

## 2024-09-02 ENCOUNTER — Other Ambulatory Visit: Payer: Self-pay

## 2024-09-02 ENCOUNTER — Encounter: Payer: Self-pay | Admitting: Intensive Care

## 2024-09-02 DIAGNOSIS — R002 Palpitations: Secondary | ICD-10-CM | POA: Insufficient documentation

## 2024-09-02 DIAGNOSIS — R079 Chest pain, unspecified: Secondary | ICD-10-CM | POA: Diagnosis not present

## 2024-09-02 DIAGNOSIS — M542 Cervicalgia: Secondary | ICD-10-CM | POA: Diagnosis not present

## 2024-09-02 DIAGNOSIS — R0789 Other chest pain: Secondary | ICD-10-CM | POA: Diagnosis not present

## 2024-09-02 LAB — BASIC METABOLIC PANEL WITH GFR
Anion gap: 8 (ref 5–15)
BUN: 9 mg/dL (ref 6–20)
CO2: 26 mmol/L (ref 22–32)
Calcium: 9.4 mg/dL (ref 8.9–10.3)
Chloride: 105 mmol/L (ref 98–111)
Creatinine, Ser: 0.83 mg/dL (ref 0.44–1.00)
GFR, Estimated: >60 mL/min
Glucose, Bld: 93 mg/dL (ref 70–99)
Potassium: 3.9 mmol/L (ref 3.5–5.1)
Sodium: 139 mmol/L (ref 135–145)

## 2024-09-02 LAB — CBC
HCT: 35.9 % — ABNORMAL LOW (ref 36.0–46.0)
Hemoglobin: 11.7 g/dL — ABNORMAL LOW (ref 12.0–15.0)
MCH: 30.7 pg (ref 26.0–34.0)
MCHC: 32.6 g/dL (ref 30.0–36.0)
MCV: 94.2 fL (ref 80.0–100.0)
Platelets: 242 10*3/uL (ref 150–400)
RBC: 3.81 MIL/uL — ABNORMAL LOW (ref 3.87–5.11)
RDW: 12.5 % (ref 11.5–15.5)
WBC: 8.5 10*3/uL (ref 4.0–10.5)
nRBC: 0 % (ref 0.0–0.2)

## 2024-09-02 LAB — TROPONIN T, HIGH SENSITIVITY
Troponin T High Sensitivity: 7 ng/L (ref 0–19)
Troponin T High Sensitivity: <6 ng/L (ref 0–19)

## 2024-09-02 LAB — D-DIMER, QUANTITATIVE: D-Dimer, Quant: 0.34 ug{FEU}/mL (ref 0.00–0.50)

## 2024-09-02 NOTE — ED Provider Notes (Signed)
 "  [facility] Provider Note    Event Date/Time   First MD Initiated Contact with Patient 09/02/24 1301     (approximate)   History   Palpitations and Chest Pain  Patient reports feeling intermittent palpitations for a few days and left neck/shoulder pain that radiates into chest since yesterday    HPI Andrea Rojas is a 34 y.o. female PMH anemia presents for evaluation of palpitations, chest discomfort - Patient is been having intermittent palpitations and left shoulder/chest discomfort over the last 2 days.  No preceding trauma or muscular strain.  No shortness of breath. - No history of DVT/PE, no recent surgery/stasis/travel, no leg swelling, is on hormonal birth control - No abdominal pain - Does note increased stress last anxiety recently  Per chart review, patient was seen in urgent care earlier today complaining of palpitations and atypical chest discomfort.  Appears has been seen by cardiology in 2024, cardiac monitor was unremarkable.     Physical Exam   Triage Vital Signs: ED Triage Vitals  Encounter Vitals Group     BP 09/02/24 1235 (!) 156/90     Girls Systolic BP Percentile --      Girls Diastolic BP Percentile --      Boys Systolic BP Percentile --      Boys Diastolic BP Percentile --      Pulse Rate 09/02/24 1235 78     Resp 09/02/24 1235 18     Temp 09/02/24 1235 98.3 F (36.8 C)     Temp Source 09/02/24 1235 Oral     SpO2 09/02/24 1235 97 %     Weight 09/02/24 1233 250 lb (113.4 kg)     Height 09/02/24 1233 5' 10 (1.778 m)     Head Circumference --      Peak Flow --      Pain Score 09/02/24 1232 5     Pain Loc --      Pain Education --      Exclude from Growth Chart --     Most recent vital signs: Vitals:   09/02/24 1235  BP: (!) 156/90  Pulse: 78  Resp: 18  Temp: 98.3 F (36.8 C)  SpO2: 97%     General: Awake, no distress.  CV:  Good peripheral perfusion. RRR, RP 2+, equal pulses all extremities Resp:  Normal  effort. CTAB Abd:  No distention. Nontender to deep palpation throughout    ED Results / Procedures / Treatments   Labs (all labs ordered are listed, but only abnormal results are displayed) Labs Reviewed  CBC - Abnormal; Notable for the following components:      Result Value   RBC 3.81 (*)    Hemoglobin 11.7 (*)    HCT 35.9 (*)    All other components within normal limits  BASIC METABOLIC PANEL WITH GFR  D-DIMER, QUANTITATIVE  POC URINE PREG, ED  TROPONIN T, HIGH SENSITIVITY  TROPONIN T, HIGH SENSITIVITY     EKG  See ED course below.   RADIOLOGY Radiology interpreted by myself and radiology report reviewed.  No acute pathology identified.    PROCEDURES:  Critical Care performed: No  Procedures   MEDICATIONS ORDERED IN ED: Medications - No data to display   IMPRESSION / MDM / ASSESSMENT AND PLAN / ED COURSE  I reviewed the triage vital signs and the nursing notes.  DDX/MDM/AP: Differential diagnosis includes, but is not limited to, transient arrhythmia, MSK strain, consider anxiety contributing, considered but doubt ACS, doubt PE, pneumothorax.  Do not clinically suspect aortic dissection.  Plan: - Labs - Chest x-ray - EKG - Cardiac monitor - Reassess  Patient's presentation is most consistent with acute presentation with potential threat to life or bodily function.  The patient is on the cardiac monitor to evaluate for evidence of arrhythmia and/or significant heart rate changes.  ED course below.  Workup unremarkable including D-dimer, serial troponins, EKG, chest x-ray.  Consider possibility of transient arrhythmia, prior Holter monitor negative, advise she can follow-up with her cardiologist to discuss possible repeat Holter monitoring.  Also recommend PMD follow-up.  Consider anxiety contributing, unclear at this time.  No evidence of acute pathology however and stable for outpatient follow-up.  ED return precautions in  place.  Patient agrees with plan.  Clinical Course as of 09/02/24 1507  Mon Sep 02, 2024  1337 CBC reviewed, unremarkable, mild stable anemia  BMP normal  Troponin normal [MM]  1337 CXR: IMPRESSION: 1. No acute cardiopulmonary pathology.   [MM]  1337 Ecg = sinus rhythm, rate 77, no gross ST elevation or depression, no significant repolarization abnormality, normal axis, normal intervals.  No clear evidence of ischemia no arrhythmia my interpretation. [MM]  1505 Dimer wnl Rpt trop wnl [MM]    Clinical Course User Index [MM] Clarine Ozell LABOR, MD     FINAL CLINICAL IMPRESSION(S) / ED DIAGNOSES   Final diagnoses:  Palpitations  Atypical chest pain     Rx / DC Orders   ED Discharge Orders     None        Note:  This document was prepared using Dragon voice recognition software and may include unintentional dictation errors.   Clarine Ozell LABOR, MD 09/02/24 1507  "

## 2024-09-02 NOTE — ED Triage Notes (Signed)
 Patient reports that she feels like a pull between her neck and  shoulder a that started yesterday. Patient also complains of palpations that started 1-2 days ago.

## 2024-09-02 NOTE — ED Notes (Signed)
 Patient is being discharged from the Urgent Care and sent to the Emergency Department via POV . Per Van Knee, MD , patient is in need of higher level of care due to Palpitation. Patient is aware and verbalizes understanding of plan of care.  Vitals:   09/02/24 1142  BP: (!) 150/83  Pulse: 82  Resp: 20  Temp: 98.7 F (37.1 C)  SpO2: 97%

## 2024-09-02 NOTE — ED Provider Notes (Signed)
 " HPI  SUBJECTIVE:  Andrea Rojas is a 34 y.o. female who presents with 2 days of palpitations described as fast, irregular heartbeats that last seconds.  She states this is similar to previous palpitations, but more intense.  She reports left-sided chest pain and dull left neck/trapezius pain that lasts up to an hour but occasionally accompanies these palpitations, although she has this discomfort without having the palpitations.  She also reports tingling radiating down her arm.  She has coffee and tea several times a week.  No herbal supplements, no stimulants.  She states that she is able to exercise without any issues.. She denies trauma to the neck, sleeping on it wrong, pain with shoulder or neck movement, change in her physical activity, recent repetitive motion with her left arm.  The neck/shoulder pain is not associated with head or neck, shoulder movement.  No aggravating or alleviating factors.  She has not tried anything for her symptoms.  She denies calf pain, swelling, surgery in the past 12 weeks, recent immobilization, hemoptysis, pleuritic chest pain.  She has a past medical history of palpitations and what been seen by cardiology in 2024.  She had a cardiac monitor that showed no significant arrhythmias including A-fib, flutter or SVT.  She is also status post lap cholecystectomy, and has a history of anemia.  Denies history of diabetes, hypercholesterolemia, coronary disease, hypertension, MI, CVA, PAD/PVD, PE/DVT, hypothyroidism, smoking, cancer.  Family history significant for cousin with MI at 21.  LMP: 2 weeks ago.  Denies possibility of being pregnant.  [facility]  Past Medical History:  Diagnosis Date   Anemia    Hx of varicella    Vaginal Pap smear, abnormal    Vitamin B12 deficiency     Past Surgical History:  Procedure Laterality Date   CHOLECYSTECTOMY N/A 09/09/2021   Procedure: LAPAROSCOPIC CHOLECYSTECTOMY;  Surgeon: Rubin Calamity, MD;  Location: [facility] OR;   Service: General;  Laterality: N/A;    Family History  Problem Relation Age of Onset   Hyperlipidemia Father    Cancer Cousin        breast   Alcohol abuse Neg Hx    Arthritis Neg Hx    Asthma Neg Hx    Birth defects Neg Hx    COPD Neg Hx    Diabetes Neg Hx    Depression Neg Hx    Drug abuse Neg Hx    Early death Neg Hx    Hearing loss Neg Hx    Heart disease Neg Hx    Hypertension Neg Hx    Kidney disease Neg Hx    Learning disabilities Neg Hx    Mental illness Neg Hx    Mental retardation Neg Hx    Miscarriages / Stillbirths Neg Hx    Stroke Neg Hx    Vision loss Neg Hx     Social History[1]  Current Medications[2]  Allergies[3]   ROS  As noted in HPI.   Physical Exam  BP (!) 150/83 (BP Location: Right Arm)   Pulse 82   Temp 98.7 F (37.1 C) (Oral)   Resp 20   LMP 08/17/2024   SpO2 97%   Constitutional: Well developed, well nourished, no acute distress Eyes: PERRL, EOMI, conjunctiva normal bilaterally HENT: Normocephalic, atraumatic,mucus membranes moist Neck: No trapezius tenderness, spasm.  No C-spine tenderness. No pain with neck flexion/extension, lateral bending, rotation Respiratory: Clear to auscultation bilaterally, no rales, no wheezing, no rhonchi Cardiovascular: Normal rate and rhythm, no murmurs,  no gallops, no rubs.  No anterior chest wall tenderness. GI: Nondistended skin: No rash, skin intact Musculoskeletal: No shoulder tenderness.  No pain with full active range of motion.  Negative empty can test.  Negative liftoff test. .  Calves symmetric, nontender, no edema Neurologic: Alert & oriented x 3, CN III-XII grossly intact, no motor deficits, sensation grossly intact Psychiatric: Speech and behavior appropriate   ED Course   Medications - No data to display  Orders Placed This Encounter  Procedures   EKG 12-Lead    Standing Status:   Standing    Number of Occurrences:   1   ED EKG    Standing Status:   Standing    Number of  Occurrences:   1    Reason for Exam:   Chest Pain   No results found for this or any previous visit (from the past 24 hours). No results found.  ED Clinical Impression  1. Palpitations   2. Chest pain, unspecified type   3. Neck pain on left side      ED Assessment/Plan    Outside records reviewed.  As noted in HPI  EKG: Normal sinus rhythm, rate 78.  Normal axis, normal intervals.  No hypertrophy.  No ST-T wave changes.  No change compared to EKG from 04/18/2023.  Patient reporting neck pain while EKG was obtained.  Patient is on OCPs, so cannot use PERC criteria, but her Wells score for PE is 0.  Patient has had palpitations before, but she is now having dull left-sided neck and chest pain with it.  I am unable to reproduce the neck and chest pain.  She has no cardiac risk factors except for family history, but I am concerned enough that I think we need to get more information to try and identify her symptoms to rule out an emergency.  However, she has normal vitals, a normal EKG, I think she is safe to go via private vehicle.  Discussed EKG, medical decision making, rationale for transfer to the emergency department with the patient.  She has opted to go to [facility].  She agrees to go immediately.   No orders of the defined types were placed in this encounter.     *This clinic note was created using Dragon dictation software. Therefore, there may be occasional mistakes despite careful proofreading. ?      [1]  Social History Tobacco Use   Smoking status: Never   Smokeless tobacco: Never  Vaping Use   Vaping status: Never Used  Substance Use Topics   Alcohol use: Yes    Comment: very rare   Drug use: No  [2] No current facility-administered medications for this encounter.  Current Outpatient Medications:    cyanocobalamin (VITAMIN B12) 1000 MCG tablet, Take 1,000 mcg by mouth daily., Disp: , Rfl:    norgestimate-ethinyl estradiol (ORTHO-CYCLEN) 0.25-35 MG-MCG tablet,  Take 1 tablet by mouth daily at 10 pm., Disp: , Rfl:  [3] No Known Allergies    Van Knee, MD 09/02/24 1222  "

## 2024-09-02 NOTE — Discharge Instructions (Signed)
 Your evaluation in the emergency department was reassuring.  I am unsure as to the exact cause of your recent symptoms, but we saw no concerning findings today.  It is possible you may have a muscle strain or anxiety contributing to your symptoms.  I do recommend you follow-up with your primary care provider and cardiologist for reevaluation.  You can use Tylenol  as needed for any mild discomfort.  Return to the emergency department with any new or worsening symptoms.

## 2024-09-02 NOTE — ED Triage Notes (Signed)
 Patient reports feeling intermittent palpitations for a few days and left neck/shoulder pain that radiates into chest since yesterday

## 2024-09-02 NOTE — Discharge Instructions (Signed)
 Your EKG was unremarkable for any ischemia, but palpitations combined with left-sided chest pain and neck pain are worrisome, especially with a family history of early heart attack.  I recommend going to the emergency department right now to get more information to try and identify the cause of your symptoms and to make sure that you are safe to go home.  Let them know immediately if your palpitations return.
